# Patient Record
Sex: Female | Born: 1950
Health system: Southern US, Community
[De-identification: ages and names within clinical notes are randomized; demographics above are authoritative.]

## PROBLEM LIST (undated history)

## (undated) DIAGNOSIS — I10 Essential (primary) hypertension: Secondary | ICD-10-CM

## (undated) DIAGNOSIS — G4730 Sleep apnea, unspecified: Secondary | ICD-10-CM

## (undated) DIAGNOSIS — C4490 Unspecified malignant neoplasm of skin, unspecified: Secondary | ICD-10-CM

## (undated) DIAGNOSIS — I4891 Unspecified atrial fibrillation: Secondary | ICD-10-CM

## (undated) DIAGNOSIS — E785 Hyperlipidemia, unspecified: Secondary | ICD-10-CM

## (undated) DIAGNOSIS — S62109A Fracture of unspecified carpal bone, unspecified wrist, initial encounter for closed fracture: Secondary | ICD-10-CM

## (undated) DIAGNOSIS — H269 Unspecified cataract: Secondary | ICD-10-CM

## (undated) DIAGNOSIS — N891 Moderate vaginal dysplasia: Secondary | ICD-10-CM

## (undated) DIAGNOSIS — K5790 Diverticulosis of intestine, part unspecified, without perforation or abscess without bleeding: Secondary | ICD-10-CM

## (undated) DIAGNOSIS — K635 Polyp of colon: Secondary | ICD-10-CM

## (undated) DIAGNOSIS — Z972 Presence of dental prosthetic device (complete) (partial): Secondary | ICD-10-CM

## (undated) DIAGNOSIS — F32A Depression, unspecified: Secondary | ICD-10-CM

## (undated) DIAGNOSIS — G43909 Migraine, unspecified, not intractable, without status migrainosus: Secondary | ICD-10-CM

## (undated) DIAGNOSIS — Z973 Presence of spectacles and contact lenses: Secondary | ICD-10-CM

## (undated) DIAGNOSIS — F329 Major depressive disorder, single episode, unspecified: Secondary | ICD-10-CM

## (undated) DIAGNOSIS — F419 Anxiety disorder, unspecified: Secondary | ICD-10-CM

## (undated) HISTORY — PX: TUBAL LIGATION: SHX77

## (undated) HISTORY — DX: Unspecified atrial fibrillation: I48.91

## (undated) HISTORY — DX: Polyp of colon: K63.5

## (undated) HISTORY — DX: Sleep apnea, unspecified: G47.30

## (undated) HISTORY — PX: POLYPECTOMY: SHX149

## (undated) HISTORY — PX: SKIN CANCER EXCISION: SHX779

## (undated) HISTORY — DX: Essential (primary) hypertension: I10

## (undated) HISTORY — DX: Anxiety disorder, unspecified: F41.9

## (undated) HISTORY — DX: Major depressive disorder, single episode, unspecified: F32.9

## (undated) HISTORY — DX: Diverticulosis of intestine, part unspecified, without perforation or abscess without bleeding: K57.90

## (undated) HISTORY — DX: Unspecified malignant neoplasm of skin, unspecified: C44.90

## (undated) HISTORY — DX: Hyperlipidemia, unspecified: E78.5

## (undated) HISTORY — DX: Depression, unspecified: F32.A

## (undated) HISTORY — DX: Moderate vaginal dysplasia: N89.1

## (undated) HISTORY — DX: Fracture of unspecified carpal bone, unspecified wrist, initial encounter for closed fracture: S62.109A

## (undated) HISTORY — DX: Migraine, unspecified, not intractable, without status migrainosus: G43.909

## (undated) HISTORY — DX: Unspecified cataract: H26.9

## (undated) HISTORY — PX: COLONOSCOPY: SHX174

---

## 1998-06-16 ENCOUNTER — Ambulatory Visit: Admission: RE | Admit: 1998-06-16 | Discharge: 1998-06-16 | Payer: Self-pay | Admitting: Family Medicine

## 1998-08-12 ENCOUNTER — Ambulatory Visit: Admission: RE | Admit: 1998-08-12 | Discharge: 1998-08-12 | Payer: Self-pay | Admitting: Internal Medicine

## 1999-01-29 ENCOUNTER — Encounter: Admission: RE | Admit: 1999-01-29 | Discharge: 1999-01-29 | Payer: Self-pay | Admitting: Family Medicine

## 1999-01-29 ENCOUNTER — Encounter: Payer: Self-pay | Admitting: Family Medicine

## 1999-08-01 ENCOUNTER — Encounter: Payer: Self-pay | Admitting: Family Medicine

## 1999-08-01 ENCOUNTER — Encounter: Admission: RE | Admit: 1999-08-01 | Discharge: 1999-08-01 | Payer: Self-pay | Admitting: Family Medicine

## 2000-02-18 ENCOUNTER — Other Ambulatory Visit: Admission: RE | Admit: 2000-02-18 | Discharge: 2000-02-18 | Payer: Self-pay | Admitting: Family Medicine

## 2001-03-02 ENCOUNTER — Other Ambulatory Visit: Admission: RE | Admit: 2001-03-02 | Discharge: 2001-03-02 | Payer: Self-pay | Admitting: Family Medicine

## 2001-03-06 ENCOUNTER — Encounter: Admission: RE | Admit: 2001-03-06 | Discharge: 2001-03-06 | Payer: Self-pay | Admitting: Family Medicine

## 2001-03-06 ENCOUNTER — Encounter: Payer: Self-pay | Admitting: Family Medicine

## 2001-04-02 ENCOUNTER — Encounter: Admission: RE | Admit: 2001-04-02 | Discharge: 2001-04-02 | Payer: Self-pay | Admitting: Family Medicine

## 2001-04-02 ENCOUNTER — Encounter: Payer: Self-pay | Admitting: Family Medicine

## 2003-01-26 ENCOUNTER — Other Ambulatory Visit: Admission: RE | Admit: 2003-01-26 | Discharge: 2003-01-26 | Payer: Self-pay | Admitting: Obstetrics and Gynecology

## 2003-02-26 HISTORY — PX: DILATION AND CURETTAGE OF UTERUS: SHX78

## 2003-02-28 ENCOUNTER — Ambulatory Visit (HOSPITAL_COMMUNITY): Admission: RE | Admit: 2003-02-28 | Discharge: 2003-02-28 | Payer: Self-pay | Admitting: Obstetrics and Gynecology

## 2003-02-28 ENCOUNTER — Encounter (INDEPENDENT_AMBULATORY_CARE_PROVIDER_SITE_OTHER): Payer: Self-pay | Admitting: Specialist

## 2003-02-28 ENCOUNTER — Ambulatory Visit (HOSPITAL_BASED_OUTPATIENT_CLINIC_OR_DEPARTMENT_OTHER): Admission: RE | Admit: 2003-02-28 | Discharge: 2003-02-28 | Payer: Self-pay | Admitting: Obstetrics and Gynecology

## 2004-01-27 ENCOUNTER — Other Ambulatory Visit: Admission: RE | Admit: 2004-01-27 | Discharge: 2004-01-27 | Payer: Self-pay | Admitting: Obstetrics and Gynecology

## 2004-02-23 ENCOUNTER — Encounter: Admission: RE | Admit: 2004-02-23 | Discharge: 2004-02-23 | Payer: Self-pay | Admitting: Obstetrics and Gynecology

## 2004-04-05 ENCOUNTER — Ambulatory Visit: Payer: Self-pay

## 2004-04-06 ENCOUNTER — Ambulatory Visit: Payer: Self-pay | Admitting: Cardiology

## 2004-04-11 ENCOUNTER — Ambulatory Visit: Payer: Self-pay | Admitting: Cardiology

## 2004-04-11 ENCOUNTER — Ambulatory Visit: Payer: Self-pay | Admitting: Cardiovascular Disease

## 2004-04-18 ENCOUNTER — Ambulatory Visit: Payer: Self-pay

## 2004-04-18 ENCOUNTER — Ambulatory Visit: Payer: Self-pay | Admitting: Cardiology

## 2004-04-25 ENCOUNTER — Ambulatory Visit: Payer: Self-pay | Admitting: Cardiology

## 2004-05-09 ENCOUNTER — Ambulatory Visit: Payer: Self-pay | Admitting: *Deleted

## 2004-05-25 ENCOUNTER — Ambulatory Visit: Payer: Self-pay | Admitting: Cardiovascular Disease

## 2004-05-25 ENCOUNTER — Ambulatory Visit: Payer: Self-pay | Admitting: Cardiology

## 2004-06-05 ENCOUNTER — Ambulatory Visit: Payer: Self-pay | Admitting: Cardiology

## 2004-06-14 ENCOUNTER — Ambulatory Visit: Payer: Self-pay | Admitting: Internal Medicine

## 2004-06-28 ENCOUNTER — Ambulatory Visit: Payer: Self-pay | Admitting: Cardiology

## 2004-07-26 ENCOUNTER — Ambulatory Visit: Payer: Self-pay | Admitting: Cardiology

## 2004-07-26 ENCOUNTER — Ambulatory Visit: Payer: Self-pay | Admitting: Cardiovascular Disease

## 2004-08-02 ENCOUNTER — Ambulatory Visit: Payer: Self-pay | Admitting: Cardiology

## 2004-08-02 ENCOUNTER — Ambulatory Visit: Payer: Self-pay

## 2004-08-08 ENCOUNTER — Ambulatory Visit: Payer: Self-pay | Admitting: Cardiology

## 2004-08-09 ENCOUNTER — Ambulatory Visit: Payer: Self-pay | Admitting: Cardiovascular Disease

## 2004-08-09 ENCOUNTER — Ambulatory Visit: Payer: Self-pay | Admitting: Internal Medicine

## 2004-08-23 ENCOUNTER — Ambulatory Visit: Payer: Self-pay | Admitting: Cardiology

## 2004-09-20 ENCOUNTER — Ambulatory Visit: Payer: Self-pay | Admitting: Cardiology

## 2004-10-11 ENCOUNTER — Ambulatory Visit: Payer: Self-pay | Admitting: Cardiology

## 2004-11-09 ENCOUNTER — Ambulatory Visit: Payer: Self-pay | Admitting: Cardiovascular Disease

## 2004-11-09 ENCOUNTER — Ambulatory Visit: Payer: Self-pay | Admitting: Cardiology

## 2004-11-23 ENCOUNTER — Ambulatory Visit: Payer: Self-pay | Admitting: Cardiology

## 2004-12-10 ENCOUNTER — Ambulatory Visit: Payer: Self-pay | Admitting: Internal Medicine

## 2004-12-31 ENCOUNTER — Ambulatory Visit: Payer: Self-pay | Admitting: Internal Medicine

## 2005-01-10 ENCOUNTER — Ambulatory Visit: Payer: Self-pay | Admitting: Cardiovascular Disease

## 2005-01-28 ENCOUNTER — Ambulatory Visit: Payer: Self-pay | Admitting: Cardiology

## 2005-02-26 ENCOUNTER — Ambulatory Visit: Payer: Self-pay | Admitting: *Deleted

## 2005-02-28 ENCOUNTER — Other Ambulatory Visit: Admission: RE | Admit: 2005-02-28 | Discharge: 2005-02-28 | Payer: Self-pay | Admitting: Obstetrics and Gynecology

## 2005-03-21 ENCOUNTER — Ambulatory Visit: Payer: Self-pay | Admitting: Cardiovascular Disease

## 2005-03-21 ENCOUNTER — Ambulatory Visit: Payer: Self-pay | Admitting: Cardiology

## 2005-04-17 ENCOUNTER — Ambulatory Visit: Payer: Self-pay | Admitting: Cardiology

## 2005-05-15 ENCOUNTER — Ambulatory Visit: Payer: Self-pay | Admitting: Cardiology

## 2005-05-15 ENCOUNTER — Ambulatory Visit: Payer: Self-pay

## 2005-06-11 ENCOUNTER — Encounter: Admission: RE | Admit: 2005-06-11 | Discharge: 2005-06-11 | Payer: Self-pay | Admitting: Family Medicine

## 2005-06-12 ENCOUNTER — Ambulatory Visit: Payer: Self-pay | Admitting: *Deleted

## 2005-07-10 ENCOUNTER — Ambulatory Visit: Payer: Self-pay | Admitting: Cardiology

## 2005-08-07 ENCOUNTER — Ambulatory Visit: Payer: Self-pay | Admitting: Cardiovascular Disease

## 2005-09-04 ENCOUNTER — Ambulatory Visit: Payer: Self-pay | Admitting: Internal Medicine

## 2005-10-02 ENCOUNTER — Ambulatory Visit: Payer: Self-pay | Admitting: Cardiology

## 2005-10-30 ENCOUNTER — Ambulatory Visit: Payer: Self-pay | Admitting: *Deleted

## 2005-10-30 ENCOUNTER — Ambulatory Visit: Payer: Self-pay | Admitting: Cardiovascular Disease

## 2005-12-02 ENCOUNTER — Ambulatory Visit: Payer: Self-pay | Admitting: Cardiovascular Disease

## 2005-12-20 ENCOUNTER — Ambulatory Visit: Payer: Self-pay | Admitting: Internal Medicine

## 2006-01-10 ENCOUNTER — Ambulatory Visit: Payer: Self-pay | Admitting: Internal Medicine

## 2006-02-07 ENCOUNTER — Ambulatory Visit: Payer: Self-pay | Admitting: Cardiology

## 2006-03-07 ENCOUNTER — Ambulatory Visit: Payer: Self-pay | Admitting: Internal Medicine

## 2006-04-04 ENCOUNTER — Ambulatory Visit: Payer: Self-pay | Admitting: *Deleted

## 2006-05-02 ENCOUNTER — Ambulatory Visit: Payer: Self-pay | Admitting: Cardiology

## 2006-05-09 ENCOUNTER — Other Ambulatory Visit: Admission: RE | Admit: 2006-05-09 | Discharge: 2006-05-09 | Payer: Self-pay | Admitting: Obstetrics and Gynecology

## 2006-05-22 ENCOUNTER — Ambulatory Visit: Payer: Self-pay | Admitting: Cardiology

## 2006-06-12 ENCOUNTER — Ambulatory Visit: Payer: Self-pay | Admitting: Internal Medicine

## 2006-06-25 ENCOUNTER — Encounter: Admission: RE | Admit: 2006-06-25 | Discharge: 2006-06-25 | Payer: Self-pay | Admitting: Family Medicine

## 2006-07-10 ENCOUNTER — Ambulatory Visit: Payer: Self-pay | Admitting: Internal Medicine

## 2006-07-24 ENCOUNTER — Ambulatory Visit: Payer: Self-pay | Admitting: *Deleted

## 2006-08-21 ENCOUNTER — Ambulatory Visit: Payer: Self-pay | Admitting: Cardiology

## 2006-09-25 ENCOUNTER — Ambulatory Visit: Payer: Self-pay | Admitting: Cardiology

## 2006-09-25 ENCOUNTER — Ambulatory Visit: Payer: Self-pay | Admitting: Cardiovascular Disease

## 2006-10-23 ENCOUNTER — Ambulatory Visit: Payer: Self-pay | Admitting: Cardiology

## 2006-11-20 ENCOUNTER — Ambulatory Visit: Payer: Self-pay | Admitting: Cardiology

## 2006-12-18 ENCOUNTER — Ambulatory Visit: Payer: Self-pay | Admitting: Cardiology

## 2007-01-15 ENCOUNTER — Ambulatory Visit: Payer: Self-pay | Admitting: Cardiology

## 2007-02-02 ENCOUNTER — Ambulatory Visit: Payer: Self-pay | Admitting: Internal Medicine

## 2007-02-23 ENCOUNTER — Ambulatory Visit: Payer: Self-pay | Admitting: Cardiology

## 2007-03-23 ENCOUNTER — Ambulatory Visit: Payer: Self-pay | Admitting: Cardiology

## 2007-04-20 ENCOUNTER — Ambulatory Visit: Payer: Self-pay | Admitting: Cardiology

## 2007-05-11 ENCOUNTER — Ambulatory Visit: Payer: Self-pay | Admitting: Internal Medicine

## 2007-05-13 ENCOUNTER — Other Ambulatory Visit: Admission: RE | Admit: 2007-05-13 | Discharge: 2007-05-13 | Payer: Self-pay | Admitting: Obstetrics and Gynecology

## 2007-05-14 ENCOUNTER — Ambulatory Visit: Payer: Self-pay | Admitting: Gastroenterology

## 2007-05-20 ENCOUNTER — Ambulatory Visit: Payer: Self-pay | Admitting: Gastroenterology

## 2007-05-20 ENCOUNTER — Encounter: Payer: Self-pay | Admitting: Gastroenterology

## 2007-06-22 ENCOUNTER — Ambulatory Visit: Payer: Self-pay | Admitting: Cardiology

## 2007-06-29 ENCOUNTER — Encounter: Admission: RE | Admit: 2007-06-29 | Discharge: 2007-06-29 | Payer: Self-pay | Admitting: Obstetrics and Gynecology

## 2007-07-17 ENCOUNTER — Ambulatory Visit: Payer: Self-pay | Admitting: Internal Medicine

## 2007-08-14 ENCOUNTER — Ambulatory Visit: Payer: Self-pay | Admitting: Cardiology

## 2007-09-11 ENCOUNTER — Ambulatory Visit: Payer: Self-pay | Admitting: Cardiovascular Disease

## 2007-10-12 ENCOUNTER — Ambulatory Visit: Payer: Self-pay | Admitting: Cardiovascular Disease

## 2007-10-12 ENCOUNTER — Ambulatory Visit: Payer: Self-pay | Admitting: Cardiology

## 2007-10-26 ENCOUNTER — Ambulatory Visit: Payer: Self-pay | Admitting: Internal Medicine

## 2007-11-10 ENCOUNTER — Ambulatory Visit: Payer: Self-pay | Admitting: Cardiovascular Disease

## 2007-12-08 ENCOUNTER — Ambulatory Visit: Payer: Self-pay | Admitting: Cardiovascular Disease

## 2008-01-05 ENCOUNTER — Ambulatory Visit: Payer: Self-pay | Admitting: Cardiology

## 2008-01-11 ENCOUNTER — Emergency Department (HOSPITAL_COMMUNITY): Admission: EM | Admit: 2008-01-11 | Discharge: 2008-01-11 | Payer: Self-pay | Admitting: Emergency Medicine

## 2008-01-26 ENCOUNTER — Ambulatory Visit: Payer: Self-pay | Admitting: Cardiovascular Disease

## 2008-02-23 ENCOUNTER — Ambulatory Visit: Payer: Self-pay | Admitting: Cardiology

## 2008-03-16 ENCOUNTER — Ambulatory Visit: Payer: Self-pay | Admitting: Cardiovascular Disease

## 2008-04-05 DIAGNOSIS — F411 Generalized anxiety disorder: Secondary | ICD-10-CM | POA: Insufficient documentation

## 2008-04-05 DIAGNOSIS — I4891 Unspecified atrial fibrillation: Secondary | ICD-10-CM

## 2008-04-05 DIAGNOSIS — E782 Mixed hyperlipidemia: Secondary | ICD-10-CM

## 2008-04-05 DIAGNOSIS — I10 Essential (primary) hypertension: Secondary | ICD-10-CM | POA: Insufficient documentation

## 2008-04-06 ENCOUNTER — Encounter: Payer: Self-pay | Admitting: Cardiovascular Disease

## 2008-04-06 ENCOUNTER — Ambulatory Visit: Payer: Self-pay | Admitting: Cardiovascular Disease

## 2008-04-06 ENCOUNTER — Ambulatory Visit: Payer: Self-pay | Admitting: Cardiology

## 2008-04-06 LAB — CONVERTED CEMR LAB
ALT: 17 units/L (ref 0–35)
AST: 20 units/L (ref 0–37)
Albumin: 4.2 g/dL (ref 3.5–5.2)
Alkaline Phosphatase: 52 units/L (ref 39–117)
Total Bilirubin: 0.8 mg/dL (ref 0.3–1.2)
Triglycerides: 151 mg/dL — ABNORMAL HIGH (ref 0–149)

## 2008-05-03 ENCOUNTER — Telehealth: Payer: Self-pay | Admitting: Gastroenterology

## 2008-05-04 ENCOUNTER — Ambulatory Visit: Payer: Self-pay | Admitting: Cardiology

## 2008-05-16 ENCOUNTER — Other Ambulatory Visit: Admission: RE | Admit: 2008-05-16 | Discharge: 2008-05-16 | Payer: Self-pay | Admitting: Obstetrics & Gynecology

## 2008-05-30 ENCOUNTER — Ambulatory Visit: Payer: Self-pay | Admitting: Gastroenterology

## 2008-06-01 ENCOUNTER — Ambulatory Visit: Payer: Self-pay | Admitting: Internal Medicine

## 2008-06-13 ENCOUNTER — Encounter: Payer: Self-pay | Admitting: Cardiovascular Disease

## 2008-06-15 ENCOUNTER — Ambulatory Visit: Payer: Self-pay | Admitting: Gastroenterology

## 2008-06-15 ENCOUNTER — Encounter: Payer: Self-pay | Admitting: Gastroenterology

## 2008-06-19 ENCOUNTER — Encounter: Payer: Self-pay | Admitting: Gastroenterology

## 2008-06-22 ENCOUNTER — Ambulatory Visit: Payer: Self-pay | Admitting: Internal Medicine

## 2008-06-29 ENCOUNTER — Ambulatory Visit: Payer: Self-pay | Admitting: Cardiovascular Disease

## 2008-07-27 ENCOUNTER — Ambulatory Visit: Payer: Self-pay | Admitting: Cardiology

## 2008-07-27 ENCOUNTER — Encounter: Payer: Self-pay | Admitting: *Deleted

## 2008-08-17 ENCOUNTER — Ambulatory Visit: Payer: Self-pay | Admitting: Cardiology

## 2008-08-31 ENCOUNTER — Encounter: Payer: Self-pay | Admitting: *Deleted

## 2008-09-07 ENCOUNTER — Ambulatory Visit: Payer: Self-pay | Admitting: Cardiology

## 2008-09-28 ENCOUNTER — Ambulatory Visit: Payer: Self-pay | Admitting: Cardiology

## 2008-10-12 DIAGNOSIS — Z85828 Personal history of other malignant neoplasm of skin: Secondary | ICD-10-CM

## 2008-10-12 DIAGNOSIS — G4730 Sleep apnea, unspecified: Secondary | ICD-10-CM | POA: Insufficient documentation

## 2008-10-12 DIAGNOSIS — Z8679 Personal history of other diseases of the circulatory system: Secondary | ICD-10-CM | POA: Insufficient documentation

## 2008-10-18 ENCOUNTER — Ambulatory Visit: Payer: Self-pay | Admitting: Cardiovascular Disease

## 2008-10-18 LAB — CONVERTED CEMR LAB: POC INR: 2.3

## 2008-11-15 ENCOUNTER — Ambulatory Visit: Payer: Self-pay | Admitting: Internal Medicine

## 2008-11-15 LAB — CONVERTED CEMR LAB: POC INR: 3

## 2008-12-13 ENCOUNTER — Ambulatory Visit: Payer: Self-pay | Admitting: Internal Medicine

## 2009-01-10 ENCOUNTER — Ambulatory Visit: Payer: Self-pay | Admitting: Cardiology

## 2009-01-10 LAB — CONVERTED CEMR LAB: POC INR: 2.8

## 2009-02-07 ENCOUNTER — Ambulatory Visit: Payer: Self-pay | Admitting: Cardiovascular Disease

## 2009-02-28 ENCOUNTER — Ambulatory Visit: Payer: Self-pay | Admitting: Cardiovascular Disease

## 2009-02-28 ENCOUNTER — Encounter (INDEPENDENT_AMBULATORY_CARE_PROVIDER_SITE_OTHER): Payer: Self-pay | Admitting: Cardiology

## 2009-02-28 LAB — CONVERTED CEMR LAB: POC INR: 2

## 2009-04-05 ENCOUNTER — Ambulatory Visit: Payer: Self-pay | Admitting: Cardiovascular Disease

## 2009-05-03 ENCOUNTER — Ambulatory Visit: Payer: Self-pay | Admitting: Cardiovascular Disease

## 2009-05-31 ENCOUNTER — Ambulatory Visit: Payer: Self-pay | Admitting: Cardiology

## 2009-06-28 ENCOUNTER — Ambulatory Visit: Payer: Self-pay | Admitting: Cardiovascular Disease

## 2009-06-28 LAB — CONVERTED CEMR LAB: POC INR: 1.8

## 2009-07-26 ENCOUNTER — Ambulatory Visit: Payer: Self-pay | Admitting: Cardiology

## 2009-07-26 LAB — CONVERTED CEMR LAB: POC INR: 4

## 2009-08-04 ENCOUNTER — Ambulatory Visit: Payer: Self-pay | Admitting: Cardiology

## 2009-08-04 LAB — CONVERTED CEMR LAB: POC INR: 2.9

## 2009-08-31 ENCOUNTER — Ambulatory Visit: Payer: Self-pay | Admitting: Internal Medicine

## 2009-08-31 LAB — CONVERTED CEMR LAB: POC INR: 3.2

## 2009-09-28 ENCOUNTER — Ambulatory Visit: Payer: Self-pay | Admitting: Internal Medicine

## 2009-10-19 ENCOUNTER — Ambulatory Visit: Payer: Self-pay | Admitting: Internal Medicine

## 2009-10-19 LAB — CONVERTED CEMR LAB: POC INR: 2.5

## 2009-11-16 ENCOUNTER — Ambulatory Visit: Payer: Self-pay | Admitting: Internal Medicine

## 2009-12-14 ENCOUNTER — Ambulatory Visit: Payer: Self-pay | Admitting: Cardiovascular Disease

## 2009-12-14 LAB — CONVERTED CEMR LAB: POC INR: 3.6

## 2009-12-28 ENCOUNTER — Ambulatory Visit: Payer: Self-pay | Admitting: Cardiology

## 2010-01-25 ENCOUNTER — Ambulatory Visit: Payer: Self-pay | Admitting: Cardiology

## 2010-01-25 LAB — CONVERTED CEMR LAB: POC INR: 2.4

## 2010-02-22 ENCOUNTER — Ambulatory Visit: Payer: Self-pay | Admitting: Cardiovascular Disease

## 2010-03-22 ENCOUNTER — Ambulatory Visit: Admission: RE | Admit: 2010-03-22 | Discharge: 2010-03-22 | Payer: Self-pay | Source: Home / Self Care

## 2010-03-22 LAB — CONVERTED CEMR LAB: POC INR: 2.6

## 2010-03-27 NOTE — Medication Information (Signed)
Summary: rov/jm  Anticoagulant Therapy  Managed by: Shelby Dubin, PharmD, BCPS, CPP Referring MD: Charlton Haws MD Supervising MD: Eden Emms MD, Theron Arista Indication 1: Atrial Fibrillation (ICD-427.31) Lab Used: [facility] Site: [facility] INR POC 2.0 INR RANGE 2 - 3  Dietary changes: no    Health status changes: no    Bleeding/hemorrhagic complications: no    Recent/future hospitalizations: no    Any changes in medication regimen? no    Recent/future dental: no  Any missed doses?: no       Is patient compliant with meds? yes       Current Medications (verified): 1)  Zoloft 50 Mg Tabs (Sertraline Hcl) .Marland Kitchen.. 1 Tab Daily 2)  Accupril 20 Mg Tabs (Quinapril Hcl) .Marland Kitchen.. 1 Tab Daily 3)  Verapamil Hcl Cr 240 Mg Xr24h-Cap (Verapamil Hcl) .Marland Kitchen.. 1 Tab Daily 4)  Warfarin Sodium 5 Mg Tabs (Warfarin Sodium) .... Use As Directed By Anticoagulation Clinic 5)  Vitamin D (Ergocalciferol) 50000 Unit Caps (Ergocalciferol) .Marland Kitchen.. 1 Cap Every Other Week. 6)  Lipitor 40 Mg Tabs (Atorvastatin Calcium) .... Take 1 Tablet By Mouth At Bedtime  Allergies (verified): 1)  ! Sulfa  Anticoagulation Management History:      The patient is taking warfarin and comes in today for a routine follow up visit.  Negative risk factors for bleeding include an age less than 46 years old.  The bleeding index is 'low risk'.  Positive CHADS2 values include History of HTN.  Negative CHADS2 values include Age > 12 years old.  The start date was 04/03/2004.  Anticoagulation responsible provider: Eden Emms MD, Theron Arista.  INR POC: 2.0.  Cuvette Lot#: [id number].  Exp: 03/2010.    Anticoagulation Management Assessment/Plan:      The patient's current anticoagulation dose is Warfarin sodium 5 mg tabs: Use as directed by Anticoagulation Clinic.  The target INR is 2 - 3.  The next INR is due 03/28/2009.  Anticoagulation instructions were given to patient.  Results were reviewed/authorized by Shelby Dubin, PharmD, BCPS, CPP.  She was notified by  Shelby Dubin PharmD, BCPS, CPP.         Prior Anticoagulation Instructions: INR 3.5  DO NOT TAKE COUMADIN TODAY.  CONTINUE TO TAKE 1.5 TABS ON MONDAY, WEDNESDAY, FRIDAY AND 1 TAB EVERY OTHER DAY.  RECHECK IN 3 WEEKS.  Current Anticoagulation Instructions: INR 2.0 Take 1.5 tabs today, then continue 1 tab daily except 1.5 tabs each Monday, Wednesday, and Friday.    Recheck in 4 weeks.

## 2010-03-27 NOTE — Medication Information (Signed)
Summary: rov/tm  Anticoagulant Therapy  Managed by: Elaina Pattee, PharmD Referring MD: Charlton Haws MD Supervising MD: Gala Romney MD, Reuel Boom Indication 1: Atrial Fibrillation (ICD-427.31) Lab Used: [facility] Site: [facility] INR POC 2.7 INR RANGE 2 - 3  Dietary changes: no    Health status changes: no    Bleeding/hemorrhagic complications: no    Recent/future hospitalizations: no    Any changes in medication regimen? no    Recent/future dental: no  Any missed doses?: no       Is patient compliant with meds? yes      Comments: Pt considering Pradaxa.  Allergies: 1)  ! Sulfa  Anticoagulation Management History:      The patient is taking warfarin and comes in today for a routine follow up visit.  Negative risk factors for bleeding include an age less than 56 years old.  The bleeding index is 'low risk'.  Positive CHADS2 values include History of HTN.  Negative CHADS2 values include Age > 95 years old.  The start date was 04/03/2004.  Anticoagulation responsible provider: Luiscarlos Kaczmarczyk MD, Reuel Boom.  INR POC: 2.7.  Cuvette Lot#: [id number].  Exp: 06/2010.    Anticoagulation Management Assessment/Plan:      The patient's current anticoagulation dose is Warfarin sodium 5 mg tabs: Use as directed by Anticoagulation Clinic.  The target INR is 2 - 3.  The next INR is due 06/28/2009.  Anticoagulation instructions were given to patient.  Results were reviewed/authorized by Elaina Pattee, PharmD.  She was notified by Elaina Pattee, PharmD.         Prior Anticoagulation Instructions: INR 3.1 Today only take 2.5mg s then resume 5mg s everday except 7.5mg s on Mondays, Wednesdays and Fridays. Recheck in 4 weeks.   Current Anticoagulation Instructions: INR 2.7. Take 5 mg daily except 7.5 mg on Mon, Wed, and Fri.

## 2010-03-27 NOTE — Letter (Signed)
Summary: Handout Printed  Printed Handout:  - Coumadin Instructions-w/out Meds 

## 2010-03-27 NOTE — Medication Information (Signed)
Summary: rov/eac  Anticoagulant Therapy  Managed by: Cloyde Reams, RN, BSN Referring MD: Tito Dine MD: Patty Sermons Indication 1: Atrial Fibrillation (ICD-427.31) Lab Used: [facility] Site: [facility] INR POC 2.7 INR RANGE 2 - 3  Dietary changes: no    Health status changes: no    Bleeding/hemorrhagic complications: no    Recent/future hospitalizations: no    Any changes in medication regimen? no    Recent/future dental: no  Any missed doses?: no       Is patient compliant with meds? yes       Allergies: 1)  ! Sulfa  Anticoagulation Management History:      The patient is taking warfarin and comes in today for a routine follow up visit.  Negative risk factors for bleeding include an age less than 70 years old.  The bleeding index is 'low risk'.  Positive CHADS2 values include History of HTN.  Negative CHADS2 values include Age > 92 years old.  The start date was 04/03/2004.  Anticoagulation responsible provider: Corra Kaine.  INR POC: 2.7.  Cuvette Lot#: [id number].  Exp: 01/2011.    Anticoagulation Management Assessment/Plan:      The patient's current anticoagulation dose is Warfarin sodium 5 mg tabs: Use as directed by Anticoagulation Clinic.  The target INR is 2 - 3.  The next INR is due 01/25/2010.  Anticoagulation instructions were given to patient.  Results were reviewed/authorized by Cloyde Reams, RN, BSN.  She was notified by Cloyde Reams RN.         Prior Anticoagulation Instructions: INR 3.6  Do NOT take coumadin today.  Then start NEW dosing schedule of 1.5 tablets on Monday only and 1 tablet all other days.  Return to clinic in 2 weeks.   Current Anticoagulation Instructions: INR 2.7  Continue on same dosage 1 tablet daily except 1.5 tablets on Mondays.  Recheck in 4 weeks.

## 2010-03-27 NOTE — Medication Information (Signed)
Summary: rov/ewj  Anticoagulant Therapy  Managed by: Weston Brass, PharmD Referring MD: Tito Dine MD: Tenny Craw MD, Gunnar Fusi Indication 1: Atrial Fibrillation (ICD-427.31) Lab Used: [facility] Site: [facility] INR POC 3.1 INR RANGE 2 - 3  Dietary changes: no    Health status changes: no    Bleeding/hemorrhagic complications: no    Recent/future hospitalizations: no    Any changes in medication regimen? no    Recent/future dental: no  Any missed doses?: no       Is patient compliant with meds? yes       Allergies: 1)  ! Sulfa  Anticoagulation Management History:      The patient is taking warfarin and comes in today for a routine follow up visit.  Negative risk factors for bleeding include an age less than 73 years old.  The bleeding index is 'low risk'.  Positive CHADS2 values include History of HTN.  Negative CHADS2 values include Age > 42 years old.  The start date was 04/03/2004.  Anticoagulation responsible provider: Tenny Craw MD, Gunnar Fusi.  INR POC: 3.1.  Cuvette Lot#: [id number].  Exp: 11/2010.    Anticoagulation Management Assessment/Plan:      The patient's current anticoagulation dose is Warfarin sodium 5 mg tabs: Use as directed by Anticoagulation Clinic.  The target INR is 2 - 3.  The next INR is due 10/19/2009.  Anticoagulation instructions were given to patient.  Results were reviewed/authorized by Weston Brass, PharmD.  She was notified by Gweneth Fritter, PharmD Candidate.         Prior Anticoagulation Instructions: INR 3.2  Take 1/2 tablet today, then start taking 1 tablet daily except 1/2 tablets on Mondays and Fridays.  Recheck in 3-4 weeks.    Current Anticoagulation Instructions: INR- 3.1   Continue taking 1 tablet (5mg ) daily except take 1.5 (7.5mg ) Mon and Fri.

## 2010-03-27 NOTE — Medication Information (Signed)
Summary: rov/sp  Anticoagulant Therapy  Managed by: Elaina Pattee, PharmD Referring MD: Charlton Haws MD Supervising MD: Myrtis Ser MD, Tinnie Gens Indication 1: Atrial Fibrillation (ICD-427.31) Lab Used: [facility] Site: [facility] INR POC 4.0 INR RANGE 2 - 3  Dietary changes: no    Health status changes: no    Bleeding/hemorrhagic complications: no    Recent/future hospitalizations: no    Any changes in medication regimen? no    Recent/future dental: no  Any missed doses?: no       Is patient compliant with meds? yes      Comments: Reviewed possible reasons for increased INRs.  Patient reports no changes except maybe less greens.  Current Medications (verified): 1)  Zoloft 50 Mg Tabs (Sertraline Hcl) .Marland Kitchen.. 1 Tab Daily 2)  Accupril 20 Mg Tabs (Quinapril Hcl) .Marland Kitchen.. 1 Tab Daily 3)  Verapamil Hcl Cr 240 Mg Xr24h-Cap (Verapamil Hcl) .Marland Kitchen.. 1 Tab Daily 4)  Warfarin Sodium 5 Mg Tabs (Warfarin Sodium) .... Use As Directed By Anticoagulation Clinic 5)  Lipitor 40 Mg Tabs (Atorvastatin Calcium) .... Take 1 Tablet By Mouth At Bedtime  Allergies (verified): 1)  ! Sulfa  Anticoagulation Management History:      The patient is taking warfarin and comes in today for a routine follow up visit.  Negative risk factors for bleeding include an age less than 49 years old.  The bleeding index is 'low risk'.  Positive CHADS2 values include History of HTN.  Negative CHADS2 values include Age > 84 years old.  The start date was 04/03/2004.  Anticoagulation responsible provider: Myrtis Ser MD, Tinnie Gens.  INR POC: 4.0.  Cuvette Lot#: [id number].  Exp: 09/2010.    Anticoagulation Management Assessment/Plan:      The patient's current anticoagulation dose is Warfarin sodium 5 mg tabs: Use as directed by Anticoagulation Clinic.  The target INR is 2 - 3.  The next INR is due 08/04/2009.  Anticoagulation instructions were given to patient.  Results were reviewed/authorized by Elaina Pattee, PharmD.  She was notified by  Elaina Pattee, PharmD.         Prior Anticoagulation Instructions: INR 1.8  Take 2 tablets today then continue same dose of 1 tablet every day except 1 1/2 tablets on Monday, Wendesday and Friday   Current Anticoagulation Instructions: INR 4.0. Hold Coumadin today, then take 0.5 tablet on Thursday, then take 1 tablet daily except 1.5 tablets on Mon, Wed, Fri.

## 2010-03-27 NOTE — Medication Information (Signed)
Summary: ROV/TM  Anticoagulant Therapy  Managed by: Weston Brass, PharmD Referring MD: Tito Dine MD: Gala Romney MD, Reuel Boom Indication 1: Atrial Fibrillation (ICD-427.31) Lab Used: [facility] Site: [facility] INR POC 3.2 INR RANGE 2 - 3  Dietary changes: yes       Details: less greens  Health status changes: no    Bleeding/hemorrhagic complications: no    Recent/future hospitalizations: no    Any changes in medication regimen? no    Recent/future dental: no  Any missed doses?: no       Is patient compliant with meds? yes       Allergies: 1)  ! Sulfa  Anticoagulation Management History:      The patient is taking warfarin and comes in today for a routine follow up visit.  Negative risk factors for bleeding include an age less than 76 years old.  The bleeding index is 'low risk'.  Positive CHADS2 values include History of HTN.  Negative CHADS2 values include Age > 29 years old.  The start date was 04/03/2004.  Anticoagulation responsible provider: Bensimhon MD, Reuel Boom.  INR POC: 3.2.  Cuvette Lot#: [id number].  Exp: 12/2010.    Anticoagulation Management Assessment/Plan:      The patient's current anticoagulation dose is Warfarin sodium 5 mg tabs: Use as directed by Anticoagulation Clinic.  The target INR is 2 - 3.  The next INR is due 12/14/2009.  Anticoagulation instructions were given to patient.  Results were reviewed/authorized by Weston Brass, PharmD.  She was notified by Harrel Carina, PharmD candidate.         Prior Anticoagulation Instructions: INR 2.5 Continue 5mg s everyday except 7.5mg s on Mondays and Fridays. Recheck in 4 weeks.    Current Anticoagulation Instructions: INR 3.2  Take 1/2 tablet today. Then resume regular schedule of 1 tablet everyday except 1 1/2 tablets on Mondays and Fridays. Re-check INR in 4 weeks.

## 2010-03-27 NOTE — Medication Information (Signed)
Summary: rov/jk  Anticoagulant Therapy  Managed by: Bethena Midget, RN, BSN Referring MD: Tito Dine MD: Graciela Husbands MD, Viviann Spare Indication 1: Atrial Fibrillation (ICD-427.31) Lab Used: [facility] Site: [facility] INR POC 2.5 INR RANGE 2 - 3  Dietary changes: no    Health status changes: no    Bleeding/hemorrhagic complications: no    Recent/future hospitalizations: no    Any changes in medication regimen? no    Recent/future dental: no  Any missed doses?: no       Is patient compliant with meds? yes       Allergies: 1)  ! Sulfa  Anticoagulation Management History:      The patient is taking warfarin and comes in today for a routine follow up visit.  Negative risk factors for bleeding include an age less than 75 years old.  The bleeding index is 'low risk'.  Positive CHADS2 values include History of HTN.  Negative CHADS2 values include Age > 45 years old.  The start date was 04/03/2004.  Anticoagulation responsible Nicholai Willette: Graciela Husbands MD, Viviann Spare.  INR POC: 2.5.  Cuvette Lot#: [id number].  Exp: 11/2010.    Anticoagulation Management Assessment/Plan:      The patient's current anticoagulation dose is Warfarin sodium 5 mg tabs: Use as directed by Anticoagulation Clinic.  The target INR is 2 - 3.  The next INR is due 11/16/2009.  Anticoagulation instructions were given to patient.  Results were reviewed/authorized by Bethena Midget, RN, BSN.  She was notified by Bethena Midget, RN, BSN.         Prior Anticoagulation Instructions: INR- 3.1   Continue taking 1 tablet (5mg ) daily except take 1.5 (7.5mg ) Mon and Fri.    Current Anticoagulation Instructions: INR 2.5 Continue 5mg s everyday except 7.5mg s on Mondays and Fridays. Recheck in 4 weeks.

## 2010-03-27 NOTE — Assessment & Plan Note (Signed)
Summary: f 6 mon/coumadin at 8:15   CC:  check up.  History of Present Illness: Linda Ruiz is seen today for F/U of her chronic afib, hypertension.  She is asymptomatic with no palpitations, PNC, orthopnea, diaphoresis or syncope.  She continues to work in [employer]	[place].  Her coumadin has been therapeutic with no bleeding diathesis.  She does not have enough riks factors to qualify for the Engage trial.  I discussed this with Paula Compton in our research department.  She does not like being on coumadin but understands that it prevents strokes Now that Pradaxa is aproved we will give her forms to ask the insurance company what the copay would be to start 150mg  two times a day.  She is excited about the possibility of not having to have INR's checked.  I did tell her thath there is no way to reverse Pradaxa like giving FFP to coumadin patients and that it was two times a day but overall I think it would be a good switch for her.  She is now seeing Dr Jacky Kindle for her primary care needs  Current Problems (verified): 1)  Atrial Fibrillation  (ICD-427.31) 2)  Coumadin Therapy  (ICD-V58.61) 3)  Essential Hypertension, Benign  (ICD-401.1) 4)  Mixed Hyperlipidemia  (ICD-272.2) 5)  Anxiety State, Unspecified  (ICD-300.00) 6)  Cerebrovascular Accident, Hx of  (ICD-V12.50) 7)  Skin Cancer, Hx of  (ICD-V10.83) 8)  Sleep Apnea  (ICD-780.57)  Current Medications (verified): 1)  Zoloft 50 Mg Tabs (Sertraline Hcl) .Marland Kitchen.. 1 Tab Daily 2)  Accupril 20 Mg Tabs (Quinapril Hcl) .Marland Kitchen.. 1 Tab Daily 3)  Verapamil Hcl Cr 240 Mg Xr24h-Cap (Verapamil Hcl) .Marland Kitchen.. 1 Tab Daily 4)  Warfarin Sodium 5 Mg Tabs (Warfarin Sodium) .... Use As Directed By Anticoagulation Clinic 5)  Vitamin D (Ergocalciferol) 50000 Unit Caps (Ergocalciferol) .Marland Kitchen.. 1 Cap Every Other Week. 6)  Lipitor 40 Mg Tabs (Atorvastatin Calcium) .... Take 1 Tablet By Mouth At Bedtime  Allergies (verified): 1)  ! Sulfa  Past History:  Past Medical History: Last  updated: 2008/11/09 Current Problems:  ATRIAL FIBRILLATION (ICD-427.31) COUMADIN THERAPY (ICD-V58.61) ESSENTIAL HYPERTENSION, BENIGN (ICD-401.1) MIXED HYPERLIPIDEMIA (ICD-272.2) ANXIETY STATE, UNSPECIFIED (ICD-300.00) CEREBROVASCULAR ACCIDENT, HX OF (ICD-V12.50) SKIN CANCER, HX OF (ICD-V10.83) SLEEP APNEA (ICD-780.57)  Past Surgical History: Last updated: 11-09-2008 2005 D&C Endometrial Polyp removal c-section tubal ligation  Family History: Last updated: Nov 09, 2008 Mother: deceased age 61/cancer Father: deceased age 61/ cancer  Social History: Last updated: 11-09-08 Married 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   Vital Signs:  Patient profile:   59 year old female Height:      62 inches Weight:      196 pounds BMI:     35.98 Pulse rate:   73 / minute Resp:     12 per minute BP sitting:   142 / 89  (left arm)  Vitals Entered By: Kem Parkinson (May 03, 2009 3:46 PM)  Physical Exam  General:  Affect appropriate Healthy:  appears stated age HEENT: normal Neck supple with no adenopathy JVP normal no bruits no thyromegaly Lungs clear with no wheezing and good diaphragmatic motion Heart:  S1/S2 no murmur,rub, gallop or click PMI normal Abdomen:  benighn, BS positve, no tenderness, no AAA no bruit.  No HSM or HJR Distal pulses intact with no bruits No edema Neuro non-focal Skin warm and dry    Impression & Recommendations:  Problem # 1:  ATRIAL FIBRILLATION (ICD-427.31) Asymptomatic with good rate control Her updated medication list for this problem includes:    Warfarin Sodium 5 Mg Tabs (Warfarin sodium) ..... Use as directed by  anticoagulation clinic  Problem # 2:  COUMADIN THERAPY (ICD-V58.61) Consider changing to Pradaxa 150mg  two times a day.  Insurance to be contacted  Change would involve starting Pradaxa after holding coumadin for 3 days  Problem # 3:  ESSENTIAL HYPERTENSION, BENIGN (ICD-401.1) Well controlled Her updated medication list for this problem includes:    Accupril 20 Mg Tabs (Quinapril hcl) .Marland Kitchen... 1 tab daily    Verapamil Hcl Cr 240 Mg Xr24h-cap (Verapamil hcl) .Marland Kitchen... 1 tab daily  Problem # 4:  MIXED HYPERLIPIDEMIA (ICD-272.2) LDL less than 130 with no history of vascular disease.  She had labs last week at [facility] Her updated medication list for this problem includes:    Lipitor 40 Mg Tabs (Atorvastatin calcium) .Marland Kitchen... Take 1 tablet by mouth at bedtime  CHOL: 192 (04/06/2008)   LDL: 125 (04/06/2008)   HDL: 37.1 (04/06/2008)   TG: 151 (04/06/2008)  Patient Instructions: 1)  Your physician recommends that you schedule a follow-up appointment in: ONE YEAR

## 2010-03-27 NOTE — Medication Information (Signed)
Summary: rov/cb  Anticoagulant Therapy  Managed by: Elaina Pattee, PharmD Referring MD: Charlton Haws MD Supervising MD: Myrtis Ser MD, Tinnie Gens Indication 1: Atrial Fibrillation (ICD-427.31) Lab Used: [facility] Site: [facility] INR POC 2.9 INR RANGE 2 - 3  Dietary changes: yes       Details: Has started eating more greens.  Health status changes: no    Bleeding/hemorrhagic complications: no    Recent/future hospitalizations: no    Any changes in medication regimen? no    Recent/future dental: no  Any missed doses?: no       Is patient compliant with meds? yes       Allergies: 1)  ! Sulfa  Anticoagulation Management History:      The patient is taking warfarin and comes in today for a routine follow up visit.  Negative risk factors for bleeding include an age less than 55 years old.  The bleeding index is 'low risk'.  Positive CHADS2 values include History of HTN.  Negative CHADS2 values include Age > 51 years old.  The start date was 04/03/2004.  Anticoagulation responsible provider: Myrtis Ser MD, Tinnie Gens.  INR POC: 2.9.  Cuvette Lot#: [id number].  Exp: 09/2010.    Anticoagulation Management Assessment/Plan:      The patient's current anticoagulation dose is Warfarin sodium 5 mg tabs: Use as directed by Anticoagulation Clinic.  The target INR is 2 - 3.  The next INR is due 08/31/2009.  Anticoagulation instructions were given to patient.  Results were reviewed/authorized by Elaina Pattee, PharmD.  She was notified by Elaina Pattee, PharmD.         Prior Anticoagulation Instructions: INR 4.0. Hold Coumadin today, then take 0.5 tablet on Thursday, then take 1 tablet daily except 1.5 tablets on Mon, Wed, Fri.  Current Anticoagulation Instructions: INR 2.9. Take 1 tablet daily except 1.5 tablets Mon, Wed, Fri.  Recheck in 4 weeks.

## 2010-03-27 NOTE — Medication Information (Signed)
Summary: rovm/appt with Jaleesa Cervi at 8:30  Anticoagulant Therapy  Managed by: Cloyde Reams, RN, BSN Referring MD: Charlton Haws MD Supervising MD: Eden Emms MD, Theron Arista Indication 1: Atrial Fibrillation (ICD-427.31) Lab Used: [facility] Site: [facility] INR POC 2.5 INR RANGE 2 - 3  Dietary changes: no    Health status changes: no    Bleeding/hemorrhagic complications: no    Recent/future hospitalizations: no    Any changes in medication regimen? no    Recent/future dental: no  Any missed doses?: no       Is patient compliant with meds? yes       Allergies (verified): 1)  ! Sulfa  Anticoagulation Management History:      The patient is taking warfarin and comes in today for a routine follow up visit.  Negative risk factors for bleeding include an age less than 55 years old.  The bleeding index is 'low risk'.  Positive CHADS2 values include History of HTN.  Negative CHADS2 values include Age > 66 years old.  The start date was 04/03/2004.  Anticoagulation responsible provider: Eden Emms MD, Theron Arista.  INR POC: 2.5.  Cuvette Lot#: [id number].  Exp: 05/2010.    Anticoagulation Management Assessment/Plan:      The patient's current anticoagulation dose is Warfarin sodium 5 mg tabs: Use as directed by Anticoagulation Clinic.  The target INR is 2 - 3.  The next INR is due 05/03/2009.  Anticoagulation instructions were given to patient.  Results were reviewed/authorized by Cloyde Reams, RN, BSN.  She was notified by Cloyde Reams RN.         Prior Anticoagulation Instructions: INR 2.0 Take 1.5 tabs today, then continue 1 tab daily except 1.5 tabs each Monday, Wednesday, and Friday.    Recheck in 4 weeks.  Current Anticoagulation Instructions: INR 2.5  Continue on same dosage 1 tablet daily except 1.5 tablets on Mondays, Wednesdays, and Fridays.  Recheck in 4 weeks.

## 2010-03-27 NOTE — Medication Information (Signed)
Summary: rov/jaj  Anticoagulant Therapy  Managed by: Eda Keys, PharmD Referring MD: Tito Dine MD: Eden Emms MD, Theron Arista Indication 1: Atrial Fibrillation (ICD-427.31) Lab Used: [facility] Site: [facility] INR POC 3.6 INR RANGE 2 - 3  Dietary changes: yes       Details: Pt has started drinking "Naked" brand juices, about every other day  Health status changes: no    Bleeding/hemorrhagic complications: no    Recent/future hospitalizations: no    Any changes in medication regimen? no    Recent/future dental: no  Any missed doses?: no       Is patient compliant with meds? yes       Allergies: 1)  ! Sulfa  Anticoagulation Management History:      The patient is taking warfarin and comes in today for a routine follow up visit.  Negative risk factors for bleeding include an age less than 58 years old.  The bleeding index is 'low risk'.  Positive CHADS2 values include History of HTN.  Negative CHADS2 values include Age > 28 years old.  The start date was 04/03/2004.  Anticoagulation responsible provider: Eden Emms MD, Theron Arista.  INR POC: 3.6.  Cuvette Lot#: [id number].  Exp: 01/2011.    Anticoagulation Management Assessment/Plan:      The patient's current anticoagulation dose is Warfarin sodium 5 mg tabs: Use as directed by Anticoagulation Clinic.  The target INR is 2 - 3.  The next INR is due 12/28/2009.  Anticoagulation instructions were given to patient.  Results were reviewed/authorized by Eda Keys, PharmD.  She was notified by Eda Keys.         Prior Anticoagulation Instructions: INR 3.2  Take 1/2 tablet today. Then resume regular schedule of 1 tablet everyday except 1 1/2 tablets on Mondays and Fridays. Re-check INR in 4 weeks.   Current Anticoagulation Instructions: INR 3.6  Do NOT take coumadin today.  Then start NEW dosing schedule of 1.5 tablets on Monday only and 1 tablet all other days.  Return to clinic in 2 weeks.

## 2010-03-27 NOTE — Medication Information (Signed)
Summary: rov/ewj  Anticoagulant Therapy  Managed by: Bethena Midget, RN, BSN Referring MD: Charlton Haws MD Supervising MD: Eden Emms MD, Theron Arista Indication 1: Atrial Fibrillation (ICD-427.31) Lab Used: [facility] Site: [facility] INR POC 3.1 INR RANGE 2 - 3  Dietary changes: yes       Details: Poor appetite due to flu last week.  Health status changes: yes       Details: Flu last week.   Bleeding/hemorrhagic complications: no    Recent/future hospitalizations: no    Any changes in medication regimen? yes       Details: Last week took Rx cough syrupPRN.  Recent/future dental: no  Any missed doses?: no       Is patient compliant with meds? yes      Comments: Seeing Dr. Eden Emms today.   Allergies: 1)  ! Sulfa  Anticoagulation Management History:      The patient is taking warfarin and comes in today for a routine follow up visit.  Negative risk factors for bleeding include an age less than 52 years old.  The bleeding index is 'low risk'.  Positive CHADS2 values include History of HTN.  Negative CHADS2 values include Age > 89 years old.  The start date was 04/03/2004.  Anticoagulation responsible provider: Eden Emms MD, Theron Arista.  INR POC: 3.1.  Cuvette Lot#: [id number].  Exp: 06/2010.    Anticoagulation Management Assessment/Plan:      The patient's current anticoagulation dose is Warfarin sodium 5 mg tabs: Use as directed by Anticoagulation Clinic.  The target INR is 2 - 3.  The next INR is due 05/31/2009.  Anticoagulation instructions were given to patient.  Results were reviewed/authorized by Bethena Midget, RN, BSN.  She was notified by Bethena Midget, RN, BSN.         Prior Anticoagulation Instructions: INR 2.5  Continue on same dosage 1 tablet daily except 1.5 tablets on Mondays, Wednesdays, and Fridays.  Recheck in 4 weeks.    Current Anticoagulation Instructions: INR 3.1 Today only take 2.5mg s then resume 5mg s everday except 7.5mg s on Mondays, Wednesdays and Fridays. Recheck in 4  weeks.

## 2010-03-27 NOTE — Medication Information (Signed)
Summary: rov/ewj  Anticoagulant Therapy  Managed by: Lyna Poser, PharmD Referring MD: Tito Dine MD: Jens Som MD, Arlys John Indication 1: Atrial Fibrillation (ICD-427.31) Lab Used: [facility] Site: [facility] INR POC 2.4 INR RANGE 2 - 3  Dietary changes: yes       Details: not eating as much greens  Health status changes: no    Bleeding/hemorrhagic complications: no    Recent/future hospitalizations: no    Any changes in medication regimen? no    Recent/future dental: no  Any missed doses?: no       Is patient compliant with meds? yes       Allergies: 1)  ! Sulfa  Anticoagulation Management History:      The patient is taking warfarin and comes in today for a routine follow up visit.  Negative risk factors for bleeding include an age less than 59 years old.  The bleeding index is 'low risk'.  Positive CHADS2 values include History of HTN.  Negative CHADS2 values include Age > 43 years old.  The start date was 04/03/2004.  Anticoagulation responsible provider: Jens Som MD, Arlys John.  INR POC: 2.4.  Cuvette Lot#: [id number].  Exp: 01/2011.    Anticoagulation Management Assessment/Plan:      The patient's current anticoagulation dose is Warfarin sodium 5 mg tabs: Use as directed by Anticoagulation Clinic.  The target INR is 2 - 3.  The next INR is due 02/22/2010.  Anticoagulation instructions were given to patient.  Results were reviewed/authorized by Lyna Poser, PharmD.         Prior Anticoagulation Instructions: INR 2.7  Continue on same dosage 1 tablet daily except 1.5 tablets on Mondays.  Recheck in 4 weeks.   Current Anticoagulation Instructions: INR 2.4 Continue taking 1.5 tablets on monday. And 1 tablet all other days. Recheck in 4 weeks.

## 2010-03-27 NOTE — Medication Information (Signed)
Summary: rov/cb  Anticoagulant Therapy  Managed by: Weston Brass, PharmD Referring MD: Charlton Haws MD Supervising MD: Gala Romney MD, Reuel Boom Indication 1: Atrial Fibrillation (ICD-427.31) Lab Used: [facility] Site: [facility] INR POC 1.8 INR RANGE 2 - 3  Dietary changes: no    Health status changes: no    Bleeding/hemorrhagic complications: no    Recent/future hospitalizations: no    Any changes in medication regimen? no    Recent/future dental: no  Any missed doses?: no       Is patient compliant with meds? yes       Allergies: 1)  ! Sulfa  Anticoagulation Management History:      The patient is taking warfarin and comes in today for a routine follow up visit.  Negative risk factors for bleeding include an age less than 27 years old.  The bleeding index is 'low risk'.  Positive CHADS2 values include History of HTN.  Negative CHADS2 values include Age > 59 years old.  The start date was 04/03/2004.  Anticoagulation responsible provider: Bensimhon MD, Reuel Boom.  INR POC: 1.8.  Cuvette Lot#: [id number].  Exp: 07/2010.    Anticoagulation Management Assessment/Plan:      The patient's current anticoagulation dose is Warfarin sodium 5 mg tabs: Use as directed by Anticoagulation Clinic.  The target INR is 2 - 3.  The next INR is due 07/26/2009.  Anticoagulation instructions were given to patient.  Results were reviewed/authorized by Weston Brass, PharmD.  She was notified by Weston Brass PharmD.         Prior Anticoagulation Instructions: INR 2.7. Take 5 mg daily except 7.5 mg on Mon, Wed, and Fri.  Current Anticoagulation Instructions: INR 1.8  Take 2 tablets today then continue same dose of 1 tablet every day except 1 1/2 tablets on Monday, Wendesday and Friday

## 2010-03-27 NOTE — Medication Information (Signed)
Summary: rov/cb  Anticoagulant Therapy  Managed by: Cloyde Reams, RN, BSN Referring MD: Charlton Haws MD Supervising MD: Tenny Craw MD, Gunnar Fusi Indication 1: Atrial Fibrillation (ICD-427.31) Lab Used: [facility] Site: [facility] INR POC 3.2 INR RANGE 2 - 3  Dietary changes: no    Health status changes: no    Bleeding/hemorrhagic complications: no    Recent/future hospitalizations: no    Any changes in medication regimen? no    Recent/future dental: no  Any missed doses?: no       Is patient compliant with meds? yes       Allergies: 1)  ! Sulfa  Anticoagulation Management History:      The patient is taking warfarin and comes in today for a routine follow up visit.  Negative risk factors for bleeding include an age less than 7 years old.  The bleeding index is 'low risk'.  Positive CHADS2 values include History of HTN.  Negative CHADS2 values include Age > 35 years old.  The start date was 04/03/2004.  Anticoagulation responsible provider: Tenny Craw MD, Gunnar Fusi.  INR POC: 3.2.  Cuvette Lot#: [id number].  Exp: 10/2010.    Anticoagulation Management Assessment/Plan:      The patient's current anticoagulation dose is Warfarin sodium 5 mg tabs: Use as directed by Anticoagulation Clinic.  The target INR is 2 - 3.  The next INR is due 09/28/2009.  Anticoagulation instructions were given to patient.  Results were reviewed/authorized by Cloyde Reams, RN, BSN.  She was notified by Cloyde Reams RN.         Prior Anticoagulation Instructions: INR 2.9. Take 1 tablet daily except 1.5 tablets Mon, Wed, Fri.  Recheck in 4 weeks.  Current Anticoagulation Instructions: INR 3.2  Take 1/2 tablet today, then start taking 1 tablet daily except 1/2 tablets on Mondays and Fridays.  Recheck in 3-4 weeks.

## 2010-03-29 NOTE — Medication Information (Signed)
Summary: rov/mw  Anticoagulant Therapy  Managed by: Eda Keys, PharmD Referring MD: Tito Dine MD: Eden Emms MD, Theron Arista Indication 1: Atrial Fibrillation (ICD-427.31) Lab Used: [facility] Site: [facility] INR RANGE 2 - 3  Dietary changes: no    Health status changes: no    Bleeding/hemorrhagic complications: no    Recent/future hospitalizations: no    Any changes in medication regimen? no    Recent/future dental: no  Any missed doses?: no       Is patient compliant with meds? yes       Allergies: 1)  ! Sulfa  Anticoagulation Management History:      The patient is taking warfarin and comes in today for a routine follow up visit.  Negative risk factors for bleeding include an age less than 58 years old.  The bleeding index is 'low risk'.  Positive CHADS2 values include History of HTN.  Negative CHADS2 values include Age > 32 years old.  The start date was 04/03/2004.  Anticoagulation responsible provider: Eden Emms MD, Theron Arista.  Cuvette Lot#: [id number].  Exp: 03/2011.    Anticoagulation Management Assessment/Plan:      The patient's current anticoagulation dose is Warfarin sodium 5 mg tabs: Use as directed by Anticoagulation Clinic.  The target INR is 2 - 3.  The next INR is due 03/22/2010.  Anticoagulation instructions were given to patient.  Results were reviewed/authorized by Eda Keys, PharmD.  She was notified by Eda Keys.         Prior Anticoagulation Instructions: INR 2.4 Continue taking 1.5 tablets on monday. And 1 tablet all other days. Recheck in 4 weeks.  Current Anticoagulation Instructions: INR 2.2  Continue taking 1.5 tablets on Monday and 1 tablet all other days.  Return to clinic in 4 weeks.

## 2010-03-29 NOTE — Medication Information (Signed)
Summary: rov/tp  Anticoagulant Therapy  Managed by: Geoffry Paradise, PharmD Referring MD: Tito Dine MD: Patty Sermons Indication 1: Atrial Fibrillation (ICD-427.31) Lab Used: [facility] Site: [facility] INR POC 2.6 INR RANGE 2 - 3  Dietary changes: no    Health status changes: no    Bleeding/hemorrhagic complications: no    Recent/future hospitalizations: no    Any changes in medication regimen? no    Recent/future dental: no  Any missed doses?: no       Is patient compliant with meds? yes       Allergies: 1)  ! Sulfa  Anticoagulation Management History:      The patient is taking warfarin and comes in today for a routine follow up visit.  Negative risk factors for bleeding include an age less than 67 years old.  The bleeding index is 'low risk'.  Positive CHADS2 values include History of HTN.  Negative CHADS2 values include Age > 68 years old.  The start date was 04/03/2004.  Anticoagulation responsible provider: Latamara Melder.  INR POC: 2.6.  Cuvette Lot#: [id number].  Exp: 02/2011.    Anticoagulation Management Assessment/Plan:      The patient's current anticoagulation dose is Warfarin sodium 5 mg tabs: Use as directed by Anticoagulation Clinic.  The target INR is 2 - 3.  The next INR is due 04/19/2010.  Anticoagulation instructions were given to patient.  Results were reviewed/authorized by Geoffry Paradise, PharmD.         Prior Anticoagulation Instructions: INR 2.2  Continue taking 1.5 tablets on Monday and 1 tablet all other days.  Return to clinic in 4 weeks.    Current Anticoagulation Instructions: INR: 2.6  (goal 2-3)  Your INR is at goal.  Continue coumadin 5 mg everyday except 7.5 mg on Mondays.  Return in 4 weeks for another INR check.

## 2010-04-13 ENCOUNTER — Encounter: Payer: Self-pay | Admitting: Cardiovascular Disease

## 2010-04-17 DIAGNOSIS — Z8679 Personal history of other diseases of the circulatory system: Secondary | ICD-10-CM

## 2010-04-17 DIAGNOSIS — I4891 Unspecified atrial fibrillation: Secondary | ICD-10-CM

## 2010-04-19 ENCOUNTER — Encounter (INDEPENDENT_AMBULATORY_CARE_PROVIDER_SITE_OTHER): Payer: Managed Care, Other (non HMO)

## 2010-04-19 ENCOUNTER — Encounter: Payer: Self-pay | Admitting: Cardiology

## 2010-04-19 DIAGNOSIS — I4891 Unspecified atrial fibrillation: Secondary | ICD-10-CM

## 2010-04-19 DIAGNOSIS — Z7901 Long term (current) use of anticoagulants: Secondary | ICD-10-CM

## 2010-04-24 NOTE — Medication Information (Signed)
Summary: rov/sp  Anticoagulant Therapy  Managed by: Weston Brass, PharmD Referring MD: Tito Dine MD: Myrtis Ser MD, Tinnie Gens Indication 1: Atrial Fibrillation (ICD-427.31) Lab Used: [facility] Site: [facility] INR POC 2.9 INR RANGE 2 - 3  Dietary changes: no    Health status changes: no    Bleeding/hemorrhagic complications: no    Recent/future hospitalizations: no    Any changes in medication regimen? no    Recent/future dental: no  Any missed doses?: no       Is patient compliant with meds? yes      Comments: Pt. has MD appt. in 5 weeks.  She did not want to come back in 4 for her INR at a separate appointment.  She was advised that if she was started on any new medicines or if any health changes occured, she would need to contact us to come in sooner.    Allergies: 1)  ! Sulfa  Anticoagulation Management History:      The patient is taking warfarin and comes in today for a routine follow up visit.  Negative risk factors for bleeding include an age less than 43 years old.  The bleeding index is 'low risk'.  Positive CHADS2 values include History of HTN.  Negative CHADS2 values include Age > 54 years old.  The start date was 04/03/2004.  Anticoagulation responsible provider: Myrtis Ser MD, Tinnie Gens.  INR POC: 2.9.  Cuvette Lot#: [id number].  Exp: 02/2011.    Anticoagulation Management Assessment/Plan:      The patient's current anticoagulation dose is Warfarin sodium 5 mg tabs: Use as directed by Anticoagulation Clinic.  The target INR is 2 - 3.  The next INR is due 05/25/2010.  Anticoagulation instructions were given to patient.  Results were reviewed/authorized by Weston Brass, PharmD.  She was notified by Margot Chimes PharmD Candidate.         Prior Anticoagulation Instructions: INR: 2.6  (goal 2-3)  Your INR is at goal.  Continue coumadin 5 mg everyday except 7.5 mg on Mondays.  Return in 4 weeks for another INR check.    Current Anticoagulation Instructions: INR  2.9  Continue to take 1 tablet everyday except for Mondays when you take 1 1/2 tablets.  Recheck INR in 4 weeks.

## 2010-05-08 NOTE — Letter (Signed)
Summary: [facility]   [facility]   Imported By: Kassie Mends 05/01/2010 10:45:20  _____________________________________________________________________  External Attachment:    Type:   Image     Comment:   External Document

## 2010-05-11 ENCOUNTER — Encounter: Payer: Self-pay | Admitting: Cardiovascular Disease

## 2010-05-15 ENCOUNTER — Other Ambulatory Visit: Payer: Self-pay | Admitting: Obstetrics and Gynecology

## 2010-05-15 DIAGNOSIS — Z1231 Encounter for screening mammogram for malignant neoplasm of breast: Secondary | ICD-10-CM

## 2010-05-25 ENCOUNTER — Encounter: Payer: Self-pay | Admitting: Cardiovascular Disease

## 2010-05-25 ENCOUNTER — Ambulatory Visit (INDEPENDENT_AMBULATORY_CARE_PROVIDER_SITE_OTHER): Payer: Managed Care, Other (non HMO) | Admitting: Cardiovascular Disease

## 2010-05-25 ENCOUNTER — Encounter: Payer: Self-pay | Admitting: Cardiology

## 2010-05-25 ENCOUNTER — Ambulatory Visit (INDEPENDENT_AMBULATORY_CARE_PROVIDER_SITE_OTHER): Payer: Managed Care, Other (non HMO) | Admitting: *Deleted

## 2010-05-25 VITALS — BP 122/92 | HR 99 | Resp 14 | Ht 62.0 in | Wt 203.0 lb

## 2010-05-25 DIAGNOSIS — Z8679 Personal history of other diseases of the circulatory system: Secondary | ICD-10-CM

## 2010-05-25 DIAGNOSIS — E782 Mixed hyperlipidemia: Secondary | ICD-10-CM

## 2010-05-25 DIAGNOSIS — Z7901 Long term (current) use of anticoagulants: Secondary | ICD-10-CM | POA: Insufficient documentation

## 2010-05-25 DIAGNOSIS — I10 Essential (primary) hypertension: Secondary | ICD-10-CM

## 2010-05-25 DIAGNOSIS — I4891 Unspecified atrial fibrillation: Secondary | ICD-10-CM

## 2010-05-25 MED ORDER — METOPROLOL SUCCINATE ER 50 MG PO TB24: 50.0000 mg | ORAL_TABLET | Freq: Every day | ORAL | Status: DC

## 2010-05-25 NOTE — Assessment & Plan Note (Signed)
Labs with Dr Jacky Kindle.  Target LDL under 130  Continue statin

## 2010-05-25 NOTE — Assessment & Plan Note (Signed)
Anxiety, new job,sisters illness contributing to elevation.  Toprol should help.  Low sodium diet

## 2010-05-25 NOTE — Patient Instructions (Signed)
Continue on same dosage 1 tablet daily except 1.5 tablets on Mondays.  Recheck  1 week after resumes Coumadin post surgery.

## 2010-05-25 NOTE — Progress Notes (Signed)
Linda Ruiz is seen today for F/U of her chronic afib, hypertension.  She is asymptomatic with no palpitations, PNC, orthopnea, diaphoresis or syncope.  She continues to work in [employer]	[place].  Her coumadin has been therapeutic with no bleeding diathesis.  Her sister is "terminal" and she needs foot surgery. She has some additional stress and BP and HR have been elevated.  Will clear for foot surgery and can stop coumadin for 5 days without lovenox overlap.  Start beta blocker in addition to verapamil for rate control.  Has F/U with Dr Jacky Kindle for hematuria.  Had been on cipro.  ROS: Denies fever, malais, weight loss, blurry vision, decreased visual acuity, cough, sputum, SOB, hemoptysis, pleuritic pain, palpitaitons, heartburn, abdominal pain, melena, lower extremity edema, claudication, or rash.   General: Affect appropriate Healthy:  appears stated age HEENT: normal Neck supple with no adenopathy JVP normal no bruits no thyromegaly Lungs clear with no wheezing and good diaphragmatic motion Heart:  S1/S2 no murmur,rub, gallop or click PMI normal Abdomen: benighn, BS positve, no tenderness, no AAA no bruit.  No HSM or HJR Distal pulses intact with no bruits No edema Neuro non-focal Skin warm and dry No muscular weakness   Current Outpatient Prescriptions  Medication Sig Dispense Refill  . atorvastatin (LIPITOR) 20 MG tablet Take 20 mg by mouth daily.        . quinapril (ACCUPRIL) 20 MG tablet Take 20 mg by mouth at bedtime.        . sertraline (ZOLOFT) 50 MG tablet Take 50 mg by mouth daily.        . verapamil (COVERA HS) 240 MG (CO) 24 hr tablet Take 240 mg by mouth at bedtime.        Marland Kitchen warfarin (COUMADIN) 5 MG tablet Take by mouth as directed.        Marland Kitchen DISCONTD: atorvastatin (LIPITOR) 40 MG tablet Take 40 mg by mouth daily.        Marland Kitchen DISCONTD: ciprofloxacin (CIPRO) 500 MG tablet         Allergies  Sulfonamide derivatives  Electrocardiogram:   Afib 99 Low voltage nonspecific  ST/T wave changes  Assessment and Plan

## 2010-05-25 NOTE — Assessment & Plan Note (Signed)
Add Toprol for rate control.  Ok to hold coumadin 5 days before foot surgery with no Lovenox bridge

## 2010-05-25 NOTE — Patient Instructions (Signed)
Your physician recommends that you schedule a follow-up appointment in: 6 months with Dr. Eden Emms Your physician has recommended you make the following change in your medication: START TOPROL 50mg  by mouth daily.

## 2010-05-27 HISTORY — PX: BUNIONECTOMY: SHX129

## 2010-05-28 ENCOUNTER — Ambulatory Visit: Payer: Managed Care, Other (non HMO)

## 2010-06-01 ENCOUNTER — Ambulatory Visit
Admission: RE | Admit: 2010-06-01 | Discharge: 2010-06-01 | Disposition: A | Discharge status: Home / Self Care | Payer: Managed Care, Other (non HMO) | Source: Ambulatory Visit | Attending: Obstetrics and Gynecology | Admitting: Obstetrics and Gynecology

## 2010-06-01 DIAGNOSIS — Z1231 Encounter for screening mammogram for malignant neoplasm of breast: Secondary | ICD-10-CM

## 2010-06-12 ENCOUNTER — Encounter: Payer: Managed Care, Other (non HMO) | Admitting: *Deleted

## 2010-06-25 ENCOUNTER — Ambulatory Visit (INDEPENDENT_AMBULATORY_CARE_PROVIDER_SITE_OTHER): Payer: Managed Care, Other (non HMO) | Admitting: *Deleted

## 2010-06-25 DIAGNOSIS — Z8679 Personal history of other diseases of the circulatory system: Secondary | ICD-10-CM

## 2010-06-25 DIAGNOSIS — I4891 Unspecified atrial fibrillation: Secondary | ICD-10-CM

## 2010-06-25 LAB — POCT INR: INR: 2.7

## 2010-07-10 NOTE — Assessment & Plan Note (Signed)
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 Artis Flock for  consideration of screening colonoscopy.   Ms. Wingard really denies any GI complaints but has never had  colonoscopy screening.  She has regular bowel movements without melena  or hematochezia and denies abdominal pain, any upper GI or hepatobiliary  problems.  She is on a Weight Watcher's diet and is gradually losing  weight and denies anorexia.  She also denies any significant food  intolerances.  She has never had sigmoidoscopy, colonoscopy, or barium  enema studies.   Past medical history remarkable for hypertension and new-onset atrial  fibrillation over the last two years, which is managed with Dr. Artis Flock  and Dr. Eden Emms.  She never had a stroke or CVA.  She does have some skin  cancers which have been excised, has sleep apnea, had a tubal ligation  21 years ago.   MEDICATIONS:  1. Lipitor 20 mg a day for hyperlipidemia.  2. Zoloft 50 mg a day for depression.  3. Accupril 20 mg a day.  4. Varying Coumadin doses, as per Coumadin clinic.  5. Rythmol 225 mg a day.  6. Verapamil 120 mg a day.   She in the past has had reactions to SULFA medications.   FAMILY HISTORY:  Noncontributory.   SOCIAL HISTORY:  She is married and lives with her husband and daughter.  She has 12th grade education.  She smokes a quarter of a pack of  cigarettes per day but denies ethanol use.   REVIEW OF SYSTEMS:  Entirely negative without cardiovascular or  pulmonary complaints.  She otherwise has a negative review of systems.   PHYSICAL EXAMINATION:  She is a healthy-appearing white female appearing  her stated age in no  acute distress.  She is 5 feet 2 and weighs 193 pounds.  Blood pressure is 124/62, and  pulse was 80 and regular.  I could not appreciate stigmata of chronic liver disease.  Her chest was entirely clear.  She was in a regular rhythm without murmurs, rubs or gallops.  There was no hepatosplenomegaly, abdominal masses, or tenderness.  Bowel  sounds were normal.  Rectal exam was deferred.  Peripheral extremities were unremarkable.  Mental status was clear.   ASSESSMENT:  1. Need for screening colonoscopy because of her age and no previous      examination.  2. Well-controlled atrial fibrillation on chronic Coumadin therapy.  3. History of well-controlled essential hypertension.  4. Patient apparently is undergoing menopause with a last menstrual      period on December 16, 2006.   RECOMMENDATIONS:  I have gone ahead and set Ms. Molder up for  colonoscopy and will hold her Coumadin five days before this procedure  unless advised otherwise per Drs. Kindl or Nishan.  The risks and  benefits of  this procedure have been explained in detail, and she has  agreed to proceed as planned.  I have asked her otherwise to continue  all of her cardiac medications during her prep days and also the day of  her colonoscopy procedure to control her ventricular rate.  If her  colonoscopy is unremarkable, she will need to follow in our screening  protocol every 10 years unless otherwise indicated.     Vania Rea. Jarold Motto, MD, Caleen Essex, FAGA  Electronically Signed    DRP/MedQ  DD: 05/14/2007  DT: 05/14/2007  Job #: [id number]   cc:   Quita Skye. Artis Flock, M.D.  Noralyn Pick. Eden Emms, MD, [facility]  Dr. Amedeo Kinsman

## 2010-07-10 NOTE — Assessment & Plan Note (Signed)
[facility] HEALTHCARE                            CARDIOLOGY OFFICE NOTE   Linda, Ruiz                    MRN:          [id number]  DATE:09/25/2006                            DOB:          1950-09-19    Linda Ruiz returns today for followup. She has paroxysmal atrial  fibrillation. She is actually only on 225 of Rythmol once a day.   She denies any significant palpitations. Interestingly though, she does  not always know when she is in atrial fibrillation. She has been in it  before and been asymptomatic.   The patient has no significant coronary artery disease. She has good  left ventricular function.   Her Coumadin levels have been fine. She is to see the [facility]  today. She has not had any bleeding, diaphysis. There has been no  palpitations, syncope, chest pain, PND or orthopnea.   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 This was removed by Dr. Ashley Ruiz. Interestingly, her Coumadin was  not stopped for this procedure. She has a followup appointment with him  in October. Review of systems is otherwise negative.   PHYSICAL EXAMINATION:  Remarkable for an overweight middle-aged white  female in no distress. Affect is appropriate. Weight is 205. Blood  pressure 130/70. Pulse 78 and regular. Respiratory rate is 14. She is  afebrile.  HEENT: Is normal. Carotids normal without bruits. There is no JVP  elevation.  NECK: Shows no thyromegaly. No lymphadenopathy. Neck is otherwise  supple.  LUNGS:  Are clear. Good diaphragmatic motion. No wheezing.  There is an S1, S2 with normal heart sounds. PMI is normal.  ABDOMEN: Is benign. Bowel sounds positive. There is no AAA. No  hepatosplenomegaly. No hepatojugular reflux. No tenderness.  Distal pulses are intact. No edema.  NEURO: Nonfocal. There is no muscular weakness.   Her baseline EKG shows sinus rhythm with a QT interval in the low 400  range and poor  R-wave progression.   IMPRESSION:  1. Paroxysmal atrial fibrillation, maintaining sinus rhythm. Will      continue Rythmol and Coumadin.  2. Hypertension. Currently under good control. Continue verapamil and      low salt diet.  3. Hyperlipidemia. Followup lipid and liver profiles in 6 months.      Continue current dose of Lipitor 20 mg a day.   Overall, I think Linda Ruiz is doing fine. I will see her back in a years'  time.     Linda Ruiz. Linda Emms, MD, [facility]  Electronically Signed    PCN/MedQ  DD: 09/25/2006  DT: 09/25/2006  Job #: [id number]

## 2010-07-10 NOTE — Assessment & Plan Note (Signed)
[facility] HEALTHCARE                            CARDIOLOGY OFFICE NOTE   CRYSTLE, CARELLI                    MRN:          [id number]  DATE:10/12/2007                            DOB:          [birth year]    Tu returns today for followup.  She has done a wonderful job, losing  weight.  She has lost about 30 pounds.  She is counting calories.   She is in chronic AFib.   At one point, AFib and paroxysmal, she tends to be totally asymptomatic.  She has more problems taking off for work to check her Coumadin than  anything else.  I told her at this point, we would stop her Rhythmol,  since it is not effective and just put her on a higher dose of verapamil  for rate control and anticoagulation.  She seems comfortable with this  idea.  There is no previous history of coronary artery disease and she  has had good LV function.  We will talk to the research nurses to see if  we can get her enrolled in the engage trial or Coumadin alternatives.   REVIEW OF SYSTEMS:  Otherwise negative.  In particular, she is not  having palpitations, shortness of breath, chest pain or any bleeding  diathesis.   She is on:  1. Lipitor 20 a day.  2. Zoloft 50 a day.  3. Accupril 20 a day.  4. Coumadin as directed.  5. Verapamil to be increased to 240 a day.  6. Rhythmol to be stopped.   PHYSICAL EXAMINATION:  GENERAL:  Remarkable for a healthy-appearing  white female, who has lost 30 pounds.  VITAL SIGNS:  Weight is 172, blood pressure is 105/69, pulse 87 and  regular, respiratory rate 14 and afebrile.  HEENT:  Unremarkable.  NECK:  Carotids are without bruit.  No lymphadenopathy, thyromegaly, or  JVP elevation.  LUNGS:  Clear.  Good diaphragmatic motion.  No wheezing.  CARDIAC:  S1 and S2.  Normal heart sounds.  PMI normal.  ABDOMEN:  Benign.  Bowel sounds positive.  No AAA.  No tenderness.  No  bruit.  No hepatosplenomegaly or hepatojugular reflux.  No tenderness.  EXTREMITIES:  Distal pulses are intact.  No edema.  NEURO:  Nonfocal.  SKIN:  Warm and dry.  No muscular weakness.   EKG shows AFib with nonspecific ST-T wave changes.   IMPRESSION:  1. Atrial fibrillation, now chronic rate control and anticoagulation.      Stopped Rhythmol.  Increased verapamil.  2. Hypertension.  Currently, well-controlled.  Hopefully, the higher      dose of verapamil will not lower her blood pressure too much .  3. Diet and weight loss.  The patient has done an extremely good job.      I think she will keep it off.  She seems to have a system in place      to count her calories.  I congratulated her on this.  4. Hypercholesterolemia, also likely to be improved with significant      weight loss.  Continue Lipitor.  Lipid and liver profile  in 6      months.  5. History of anxiety and depression.  Continue Zoloft.  The patient's      mood is much better, since she feels so much better with her weight      loss.  She will continue her Zoloft as needed.   I will see her back in 6 months.     Noralyn Pick. Eden Emms, MD, [facility]  Electronically Signed    PCN/MedQ  DD: 10/12/2007  DT: 10/13/2007  Job #: [id number]

## 2010-07-13 NOTE — Op Note (Signed)
NAME:  Linda Ruiz, Linda Ruiz                       ACCOUNT NO.:  [id number]   MEDICAL RECORD NO.:  [id number]                   PATIENT TYPE:  AMB   LOCATION:  [facility]                                 FACILITY:  [facility]   PHYSICIAN:  Cynthia P. Romine, M.D.             DATE OF BIRTH:  [birth year]   DATE OF PROCEDURE:  02/28/2003  DATE OF DISCHARGE:                                 OPERATIVE REPORT   PREOPERATIVE DIAGNOSES:  1. Perimenopausal.  2. Abnormal bleeding.  3. Known endometrial polyps.   POSTOPERATIVE DIAGNOSES:  1. Perimenopausal.  2. Abnormal bleeding.  3. Known endometrial polyps.  4. Pathology pending.   PROCEDURES:  1. Hysteroscopic resection of endometrial polyps.  2. Dilatation and curettage.   SURGEON:  Cynthia P. Romine, M.D.   ANESTHESIA:  General by LMA.   ESTIMATED BLOOD LOSS:  Minimal.   ESTIMATED SORBITOL DEFICIT:  50 mL.   COMPLICATIONS:  None.   DESCRIPTION OF PROCEDURE:  The patient was taken to the operating room and  after induction of adequate general anesthesia by LMA was placed in the  dorsal lithotomy position and prepped and draped in the usual fashion.  The  bladder was drained with a red rubber catheter.  The cervix was grasped on  its anterior lip with a single-tooth tenaculum and was sounded to 9 cm.  The  cervix was dilated to a #27 Shawnie Pons.  Diagnostic hysteroscope was introduced.  Several endometrial polyps were noted as was a fairly lush endometrium, and  it was felt that resection would be indicated.  The diagnostic scope was  removed.  The cervix was dilated to a #31 Pratt.  The operative hysteroscope  was introduced, sorbitol was used as a distention medium at a pressure of 90  mmHg.  Endometrial polyps were resected with a single loop.  The resection  required several passes with the loop for removal of the polyp.  The  hysteroscope was removed.  Curettage was carried out.  The specimen was sent  to pathology.  The hysteroscope was  reintroduced.  There were several pieces  of loose tissue that were removed with the hysteroscope  and sent for pathology with the specimen, but otherwise the endometrial  cavity appeared clean.  Photographic documentation was taken.  The scope was  withdrawn, the instruments removed from the vagina, and the procedure was  terminated.                                               Cynthia P. Romine, M.D.    CPR/MEDQ  D:  02/28/2003  T:  02/28/2003  Job:  [id number]

## 2010-07-13 NOTE — Assessment & Plan Note (Signed)
[facility] HEALTHCARE                              CARDIOLOGY OFFICE NOTE   Linda Ruiz, Linda Ruiz                    MRN:          [id number]  DATE:10/30/2005                            DOB:          07/08/1950    Linda Ruiz returns today for follow-up.  She has had PAF.  She has been  maintained on verapamil and Rythmol.  She had a stress test a few months ago  with no proarrhythmia.   She has not had any significant palpitations, PND or orthopnea.   He Coumadin levels have been great.  She actually feels quite well.   PHYSICAL EXAMINATION:  VITAL SIGNS:  Blood pressure is 116/72, pulse 84 and  regular.  LUNGS:  Clear.  NECK:  Carotids are normal.  CARDIAC:  There is an S1, S2, with normal heart sounds.  ABDOMEN:  Benign.  EXTREMITIES:  Lower extremities with good distal pulses, no edema.   IMPRESSION:  Stable paroxysmal atrial fibrillation, currently maintained on  Rythmol and verapamil, asymptomatic.   Continue Coumadin.  I explained to the patient that she may be a candidate  for the ROCKET trial or some other Coumadin alternative in the future.  For  the time being I am pleased that she is asymptomatic and seems to be in  sinus rhythm.   I will see her back in about 3 months, and we will continue to monitor her  rhythm.  It was interesting that she actually was in sinus rhythm and went  into atrial fibrillation during her stress test.                               Theron Arista C. Eden Emms, MD, [facility]    PCN/MedQ  DD:  10/30/2005  DT:  10/30/2005  Job #:  [id number]

## 2010-07-15 ENCOUNTER — Other Ambulatory Visit: Payer: Self-pay | Admitting: Cardiovascular Disease

## 2010-07-20 ENCOUNTER — Ambulatory Visit (INDEPENDENT_AMBULATORY_CARE_PROVIDER_SITE_OTHER): Payer: Managed Care, Other (non HMO) | Admitting: *Deleted

## 2010-07-20 DIAGNOSIS — I4891 Unspecified atrial fibrillation: Secondary | ICD-10-CM

## 2010-07-20 DIAGNOSIS — Z8679 Personal history of other diseases of the circulatory system: Secondary | ICD-10-CM

## 2010-07-24 ENCOUNTER — Other Ambulatory Visit: Payer: Self-pay | Admitting: *Deleted

## 2010-07-26 ENCOUNTER — Encounter: Payer: Managed Care, Other (non HMO) | Admitting: *Deleted

## 2010-08-17 ENCOUNTER — Encounter: Payer: Managed Care, Other (non HMO) | Admitting: *Deleted

## 2010-08-21 ENCOUNTER — Ambulatory Visit (INDEPENDENT_AMBULATORY_CARE_PROVIDER_SITE_OTHER): Payer: Managed Care, Other (non HMO) | Admitting: *Deleted

## 2010-08-21 DIAGNOSIS — Z8679 Personal history of other diseases of the circulatory system: Secondary | ICD-10-CM

## 2010-08-21 DIAGNOSIS — I4891 Unspecified atrial fibrillation: Secondary | ICD-10-CM

## 2010-09-18 ENCOUNTER — Ambulatory Visit (INDEPENDENT_AMBULATORY_CARE_PROVIDER_SITE_OTHER): Payer: Managed Care, Other (non HMO) | Admitting: *Deleted

## 2010-09-18 DIAGNOSIS — Z8679 Personal history of other diseases of the circulatory system: Secondary | ICD-10-CM

## 2010-09-18 DIAGNOSIS — I4891 Unspecified atrial fibrillation: Secondary | ICD-10-CM

## 2010-10-02 ENCOUNTER — Ambulatory Visit (INDEPENDENT_AMBULATORY_CARE_PROVIDER_SITE_OTHER): Payer: Managed Care, Other (non HMO) | Admitting: *Deleted

## 2010-10-02 DIAGNOSIS — Z8679 Personal history of other diseases of the circulatory system: Secondary | ICD-10-CM

## 2010-10-02 DIAGNOSIS — I4891 Unspecified atrial fibrillation: Secondary | ICD-10-CM

## 2010-10-23 ENCOUNTER — Ambulatory Visit (INDEPENDENT_AMBULATORY_CARE_PROVIDER_SITE_OTHER): Payer: Managed Care, Other (non HMO) | Admitting: *Deleted

## 2010-10-23 DIAGNOSIS — Z8679 Personal history of other diseases of the circulatory system: Secondary | ICD-10-CM

## 2010-10-23 DIAGNOSIS — I4891 Unspecified atrial fibrillation: Secondary | ICD-10-CM

## 2010-10-30 ENCOUNTER — Encounter: Payer: Self-pay | Admitting: Cardiovascular Disease

## 2010-10-30 ENCOUNTER — Other Ambulatory Visit: Payer: Self-pay | Admitting: Cardiovascular Disease

## 2010-10-30 ENCOUNTER — Ambulatory Visit (INDEPENDENT_AMBULATORY_CARE_PROVIDER_SITE_OTHER): Payer: Managed Care, Other (non HMO) | Admitting: Cardiovascular Disease

## 2010-10-30 DIAGNOSIS — E782 Mixed hyperlipidemia: Secondary | ICD-10-CM

## 2010-10-30 DIAGNOSIS — I10 Essential (primary) hypertension: Secondary | ICD-10-CM

## 2010-10-30 DIAGNOSIS — I4891 Unspecified atrial fibrillation: Secondary | ICD-10-CM

## 2010-10-30 NOTE — Patient Instructions (Signed)
Your physician wants you to follow-up in: ONE YEAR You will receive a reminder letter in the mail two months in advance. If you don't receive a letter, please call our office to schedule the follow-up appointment.  

## 2010-10-30 NOTE — Assessment & Plan Note (Signed)
Cholesterol is at goal.  Continue current dose of statin and diet Rx.  No myalgias or side effects.  F/U  LFT's in 6 months. Lab Results  Component Value Date   LDLCALC 125* 04/06/2008             

## 2010-10-30 NOTE — Progress Notes (Signed)
Linda Ruiz is seen today for F/U of her chronic afib, hypertension. She is asymptomatic with no palpitations, PNC, orthopnea, diaphoresis or syncope. She continues to work in [employer]	[place]. Her coumadin has been therapeutic She was thinking of changing to Pradaxa.  However with history of hematuria and no antidote I told her I probably would not.  Sister still terminally ill.  Had foot surgery with Dr Lanice Schwab with good results.  Stopped coumadin with no lovenox and did fine  ROS: Denies fever, malais, weight loss, blurry vision, decreased visual acuity, cough, sputum, SOB, hemoptysis, pleuritic pain, palpitaitons, heartburn, abdominal pain, melena, lower extremity edema, claudication, or rash.  All other systems reviewed and negative  General: Affect appropriate Healthy:  appears stated age HEENT: normal Neck supple with no adenopathy JVP normal no bruits no thyromegaly Lungs clear with no wheezing and good diaphragmatic motion Heart:  S1/S2 no murmur,rub, gallop or click PMI normal Abdomen: benighn, BS positve, no tenderness, no AAA no bruit.  No HSM or HJR Distal pulses intact with no bruits No edema Neuro non-focal Skin warm and dry No muscular weakness   Current Outpatient Prescriptions  Medication Sig Dispense Refill  . atorvastatin (LIPITOR) 20 MG tablet Take 20 mg by mouth daily.        . metoprolol (TOPROL-XL) 50 MG 24 hr tablet Take 1 tablet (50 mg total) by mouth daily.  30 tablet  8  . quinapril (ACCUPRIL) 20 MG tablet Take 20 mg by mouth at bedtime.        . sertraline (ZOLOFT) 50 MG tablet Take 50 mg by mouth daily.        . verapamil (CALAN-SR) 240 MG CR tablet take 1 capsule by mouth once daily  30 tablet  3  . warfarin (COUMADIN) 5 MG tablet as directed PER ANTICOAGULATION CLINIC  45 tablet  3    Allergies  Sulfonamide derivatives  Electrocardiogram:  Assessment and Plan

## 2010-10-30 NOTE — Assessment & Plan Note (Signed)
Rate control improved with beta blocker and headaches gone.  Encouraged her to stay on coumadin and not change to Pradaxa for now

## 2010-10-30 NOTE — Assessment & Plan Note (Signed)
Well controlled.  Continue current medications and low sodium Dash type diet.    

## 2010-11-10 ENCOUNTER — Other Ambulatory Visit: Payer: Self-pay | Admitting: Cardiovascular Disease

## 2010-11-20 ENCOUNTER — Ambulatory Visit (INDEPENDENT_AMBULATORY_CARE_PROVIDER_SITE_OTHER): Payer: Managed Care, Other (non HMO) | Admitting: *Deleted

## 2010-11-20 DIAGNOSIS — Z8679 Personal history of other diseases of the circulatory system: Secondary | ICD-10-CM

## 2010-11-20 DIAGNOSIS — I4891 Unspecified atrial fibrillation: Secondary | ICD-10-CM

## 2010-11-20 LAB — POCT INR: INR: 2.6

## 2010-11-27 LAB — PROTIME-INR
INR: 1.9 — ABNORMAL HIGH
Prothrombin Time: 23.3 — ABNORMAL HIGH

## 2010-11-27 LAB — POCT CARDIAC MARKERS
CKMB, poc: <1 — ABNORMAL LOW
Myoglobin, poc: 61.5

## 2010-12-18 ENCOUNTER — Ambulatory Visit (INDEPENDENT_AMBULATORY_CARE_PROVIDER_SITE_OTHER): Payer: Managed Care, Other (non HMO) | Admitting: *Deleted

## 2010-12-18 DIAGNOSIS — I4891 Unspecified atrial fibrillation: Secondary | ICD-10-CM

## 2010-12-18 DIAGNOSIS — Z8679 Personal history of other diseases of the circulatory system: Secondary | ICD-10-CM

## 2010-12-18 DIAGNOSIS — Z7901 Long term (current) use of anticoagulants: Secondary | ICD-10-CM

## 2011-01-29 ENCOUNTER — Ambulatory Visit (INDEPENDENT_AMBULATORY_CARE_PROVIDER_SITE_OTHER): Payer: Managed Care, Other (non HMO) | Admitting: *Deleted

## 2011-01-29 DIAGNOSIS — Z8679 Personal history of other diseases of the circulatory system: Secondary | ICD-10-CM

## 2011-01-29 DIAGNOSIS — I4891 Unspecified atrial fibrillation: Secondary | ICD-10-CM

## 2011-01-29 DIAGNOSIS — Z7901 Long term (current) use of anticoagulants: Secondary | ICD-10-CM

## 2011-02-22 ENCOUNTER — Other Ambulatory Visit: Payer: Self-pay | Admitting: Cardiovascular Disease

## 2011-03-10 ENCOUNTER — Other Ambulatory Visit: Payer: Self-pay | Admitting: Cardiovascular Disease

## 2011-03-11 ENCOUNTER — Other Ambulatory Visit: Payer: Self-pay

## 2011-03-11 MED ORDER — VERAPAMIL HCL ER 240 MG PO TBCR: 240.0000 mg | EXTENDED_RELEASE_TABLET | Freq: Every day | ORAL | Status: DC

## 2011-03-12 ENCOUNTER — Ambulatory Visit (INDEPENDENT_AMBULATORY_CARE_PROVIDER_SITE_OTHER): Payer: Managed Care, Other (non HMO) | Admitting: *Deleted

## 2011-03-12 DIAGNOSIS — I4891 Unspecified atrial fibrillation: Secondary | ICD-10-CM

## 2011-03-12 DIAGNOSIS — Z8679 Personal history of other diseases of the circulatory system: Secondary | ICD-10-CM

## 2011-03-12 DIAGNOSIS — Z7901 Long term (current) use of anticoagulants: Secondary | ICD-10-CM

## 2011-03-12 LAB — POCT INR: INR: 2.4

## 2011-04-22 ENCOUNTER — Other Ambulatory Visit: Payer: Self-pay | Admitting: Cardiovascular Disease

## 2011-04-23 ENCOUNTER — Ambulatory Visit (INDEPENDENT_AMBULATORY_CARE_PROVIDER_SITE_OTHER): Payer: Managed Care, Other (non HMO)

## 2011-04-23 DIAGNOSIS — Z8679 Personal history of other diseases of the circulatory system: Secondary | ICD-10-CM

## 2011-04-23 DIAGNOSIS — Z7901 Long term (current) use of anticoagulants: Secondary | ICD-10-CM

## 2011-04-23 DIAGNOSIS — I4891 Unspecified atrial fibrillation: Secondary | ICD-10-CM

## 2011-04-23 LAB — POCT INR: INR: 2.5

## 2011-05-27 DIAGNOSIS — N891 Moderate vaginal dysplasia: Secondary | ICD-10-CM

## 2011-05-27 HISTORY — DX: Moderate vaginal dysplasia: N89.1

## 2011-06-04 ENCOUNTER — Ambulatory Visit (INDEPENDENT_AMBULATORY_CARE_PROVIDER_SITE_OTHER): Payer: Managed Care, Other (non HMO) | Admitting: *Deleted

## 2011-06-04 DIAGNOSIS — Z8679 Personal history of other diseases of the circulatory system: Secondary | ICD-10-CM

## 2011-06-04 DIAGNOSIS — Z7901 Long term (current) use of anticoagulants: Secondary | ICD-10-CM

## 2011-06-04 DIAGNOSIS — I4891 Unspecified atrial fibrillation: Secondary | ICD-10-CM

## 2011-06-05 ENCOUNTER — Encounter: Payer: Self-pay | Admitting: Gastroenterology

## 2011-06-05 ENCOUNTER — Encounter: Payer: Self-pay | Admitting: *Deleted

## 2011-06-05 ENCOUNTER — Telehealth: Payer: Self-pay

## 2011-06-05 NOTE — Telephone Encounter (Signed)
Message copied by Jessee Avers on Wed Jun 05, 2011  3:04 PM ------      Message from: [place], [place] E      Created: Wed Jun 05, 2011  2:29 PM       PT MAY HOLD COUMADIN 4-5 DAYS PRIOR TO COLONOSCOPY PER DR Eden Emms.      CHRISTINE

## 2011-06-05 NOTE — Telephone Encounter (Signed)
Pt notified to come off coumadin 5 days before there procedure per Dr. Eden Emms. Pt verbalized understanding.

## 2011-06-06 ENCOUNTER — Encounter: Payer: Self-pay | Admitting: Gastroenterology

## 2011-06-25 ENCOUNTER — Ambulatory Visit: Payer: Managed Care, Other (non HMO) | Admitting: Gastroenterology

## 2011-06-25 ENCOUNTER — Ambulatory Visit (INDEPENDENT_AMBULATORY_CARE_PROVIDER_SITE_OTHER): Payer: Managed Care, Other (non HMO)

## 2011-06-25 DIAGNOSIS — Z8679 Personal history of other diseases of the circulatory system: Secondary | ICD-10-CM

## 2011-06-25 DIAGNOSIS — Z7901 Long term (current) use of anticoagulants: Secondary | ICD-10-CM

## 2011-06-25 DIAGNOSIS — I4891 Unspecified atrial fibrillation: Secondary | ICD-10-CM

## 2011-06-25 NOTE — Patient Instructions (Signed)
Resume Coumadin per MD, take 1.5 tablets x 2 doses, then resume same dosage 1 tablet daily except 1.5 tablets on Mondays.

## 2011-06-26 ENCOUNTER — Encounter: Payer: Self-pay | Admitting: Gynecologic Oncology

## 2011-06-26 ENCOUNTER — Ambulatory Visit: Payer: Managed Care, Other (non HMO) | Attending: Gynecologic Oncology | Admitting: Gynecologic Oncology

## 2011-06-26 VITALS — BP 102/70 | HR 64 | Temp 98.2°F | Resp 16 | Ht 62.44 in | Wt 200.0 lb

## 2011-06-26 DIAGNOSIS — D071 Carcinoma in situ of vulva: Secondary | ICD-10-CM | POA: Insufficient documentation

## 2011-06-26 DIAGNOSIS — Z8673 Personal history of transient ischemic attack (TIA), and cerebral infarction without residual deficits: Secondary | ICD-10-CM | POA: Insufficient documentation

## 2011-06-26 DIAGNOSIS — I10 Essential (primary) hypertension: Secondary | ICD-10-CM | POA: Insufficient documentation

## 2011-06-26 DIAGNOSIS — I4891 Unspecified atrial fibrillation: Secondary | ICD-10-CM | POA: Insufficient documentation

## 2011-06-26 DIAGNOSIS — E785 Hyperlipidemia, unspecified: Secondary | ICD-10-CM | POA: Insufficient documentation

## 2011-06-26 DIAGNOSIS — Z79899 Other long term (current) drug therapy: Secondary | ICD-10-CM | POA: Insufficient documentation

## 2011-06-26 DIAGNOSIS — G4730 Sleep apnea, unspecified: Secondary | ICD-10-CM | POA: Insufficient documentation

## 2011-06-26 NOTE — Progress Notes (Signed)
Consult Note: Gyn-Onc  Linda Ruiz 61 y.o. female  CC:  Chief Complaint  Patient presents with  . Recurrent VIN III    New pt    HPI: Patient is seen today in consultation at the request of Dr. Tresa Res.   Patient is a 61 year old gravida 1 para 1 who was seen by Dr. Tawanna Cooler On April 3. This was the time of her annual examination. At that time a lesion was noted on the right inferior aspect of the fascia. A small excision was done in the office of the lesion. It revealed VIN-III. She comes in today for evaluation of this.the patient states she has a history of vulvar dysplasia back in 2008 at which time she had a wide local excision. I do not know what the margin status was. The patient has never been symptomatic from this. She states that Dr. Tresa Res as noticed the lesion at each of the time of her annual exams. She denies any bleeding pruritus or pain.  Interval History:  When she saw her primary physician Dr. Lelon Frohlich she was noted to have microscopic hematuria. She has recently seen a urologist. CT scan is pending as is her cystoscopy. The urine culture was negative. She denies any other complaints. Does have occasional stress urinary incontinence with coughing. She denies any change in her bowel or bladder habits otherwise. Review of Systems  Current Meds:  Outpatient Encounter Prescriptions as of 06/26/2011  Medication Sig Dispense Refill  . atorvastatin (LIPITOR) 20 MG tablet Take 20 mg by mouth daily.        . metoprolol (TOPROL-XL) 50 MG 24 hr tablet take 1 tablet by mouth once daily  30 tablet  8  . quinapril (ACCUPRIL) 20 MG tablet Take 20 mg by mouth at bedtime.        . sertraline (ZOLOFT) 50 MG tablet Take 50 mg by mouth daily.        . verapamil (CALAN-SR) 240 MG CR tablet Take 1 tablet (240 mg total) by mouth at bedtime.  30 tablet  9  . warfarin (COUMADIN) 5 MG tablet as directed PER ANTICOAGULATION CLINIC  45 tablet  3    Allergy:  Allergies  Allergen Reactions  .  Sulfonamide Derivatives Rash    Social Hx:   History   Social History  . Marital Status: Married    Spouse Name: N/A    Number of Children: N/A  . Years of Education: N/A   Occupational History  . customer service    Social History Main Topics  . Smoking status: Current Everyday Smoker -- 0.5 packs/day for 8 years    Types: Cigarettes  . Smokeless tobacco: Not on file  . Alcohol Use: Yes     Rare  . Drug Use: No  . Sexually Active: Not Currently   Other Topics Concern  . Not on file   Social History Narrative  . No narrative on file    Past Surgical Hx:  Past Surgical History  Procedure Date  . Cesarean section   . Tubaligation   . Polypectomy     endometrial polyp removed 2005 D&C  . Foot surgery 05/2010    Left  . Skin cancer removal     this past year and five years ago    Past Medical Hx:  Past Medical History  Diagnosis Date  . Atrial fibrillation   . Hypertension   . Hyperlipidemia   . Anxiety   . CVA (cerebral infarction)   .  Skin cancer   . Sleep apnea     Family Hx:  Family History  Problem Relation Age of Onset  . Cancer      mother and father    Vitals:  Blood pressure 102/70, pulse 64, temperature 98.2 F (36.8 C), resp. rate 16, height 5' 2.44" (1.586 m), weight 200 lb (90.719 kg).  Physical Exam:  Well-nourished well-developed female in no acute distress.  Neck: No lymphadenopathy no thyromegaly.  Lungs: Clear to auscultation bilaterally. The cardiovascular: Regular rate and rhythm.  Abdomen: Soft, nondistended, nondistended.  Groins: No lymphadenopathy.  Extremities: Surgical incision left foot. No edema.  Pelvic: External genitalia notable for a white placed hyperkeratotic area in the right labia majora. It measures approximately 5 x 2 mm. There are no other visible lesions Assessment/Plan: 61 year old gravida 1 para 1 with VIN-III. I believe this area and it should be excised completely and I do believe we can do that  in the office. He'll be scheduled for that. She does not necessarily have to come off of her Coumadin for that minor procedure. She has her cystoscopy the same day and I would defer to her urologist regarding this. We also discussed smoking cessation and the association with smoking and vulvar dysplasia. She states she was not aware of this. She had quit smoking for many years she has approximately 15 years total smoking but recently picked up in the last 8. She will consider smoking cessation programs.  Elliotte Marsalis A., MD 06/26/2011, 11:03 AM

## 2011-06-26 NOTE — Patient Instructions (Signed)
RTC for procedure

## 2011-07-03 ENCOUNTER — Encounter: Payer: Self-pay | Admitting: Gastroenterology

## 2011-07-03 ENCOUNTER — Ambulatory Visit (AMBULATORY_SURGERY_CENTER): Payer: Managed Care, Other (non HMO) | Admitting: *Deleted

## 2011-07-03 VITALS — Ht 62.0 in | Wt 199.6 lb

## 2011-07-03 DIAGNOSIS — Z1211 Encounter for screening for malignant neoplasm of colon: Secondary | ICD-10-CM

## 2011-07-03 DIAGNOSIS — Z86010 Personal history of colonic polyps: Secondary | ICD-10-CM

## 2011-07-03 MED ORDER — PEG-KCL-NACL-NASULF-NA ASC-C 100 G PO SOLR: 1.0000 | Freq: Once | ORAL | Status: DC

## 2011-07-10 ENCOUNTER — Ambulatory Visit: Payer: Managed Care, Other (non HMO) | Admitting: Gynecologic Oncology

## 2011-07-10 ENCOUNTER — Encounter: Payer: Self-pay | Admitting: Gynecologic Oncology

## 2011-07-10 ENCOUNTER — Ambulatory Visit: Payer: Managed Care, Other (non HMO) | Attending: Gynecologic Oncology | Admitting: Gynecologic Oncology

## 2011-07-10 VITALS — BP 110/64 | HR 66 | Temp 97.7°F | Resp 16 | Ht 62.44 in | Wt 199.1 lb

## 2011-07-10 DIAGNOSIS — I10 Essential (primary) hypertension: Secondary | ICD-10-CM | POA: Insufficient documentation

## 2011-07-10 DIAGNOSIS — Z85828 Personal history of other malignant neoplasm of skin: Secondary | ICD-10-CM | POA: Insufficient documentation

## 2011-07-10 DIAGNOSIS — L821 Other seborrheic keratosis: Secondary | ICD-10-CM | POA: Insufficient documentation

## 2011-07-10 DIAGNOSIS — Z8673 Personal history of transient ischemic attack (TIA), and cerebral infarction without residual deficits: Secondary | ICD-10-CM | POA: Insufficient documentation

## 2011-07-10 DIAGNOSIS — D071 Carcinoma in situ of vulva: Secondary | ICD-10-CM | POA: Insufficient documentation

## 2011-07-10 DIAGNOSIS — F17200 Nicotine dependence, unspecified, uncomplicated: Secondary | ICD-10-CM | POA: Insufficient documentation

## 2011-07-10 DIAGNOSIS — Z79899 Other long term (current) drug therapy: Secondary | ICD-10-CM | POA: Insufficient documentation

## 2011-07-10 DIAGNOSIS — Z7901 Long term (current) use of anticoagulants: Secondary | ICD-10-CM | POA: Insufficient documentation

## 2011-07-10 DIAGNOSIS — E785 Hyperlipidemia, unspecified: Secondary | ICD-10-CM | POA: Insufficient documentation

## 2011-07-10 DIAGNOSIS — G4730 Sleep apnea, unspecified: Secondary | ICD-10-CM | POA: Insufficient documentation

## 2011-07-10 NOTE — Patient Instructions (Signed)
Perineal care as instructed.

## 2011-07-10 NOTE — Progress Notes (Signed)
Consult Note: Gyn-Onc  [name] 61 y.o. female  CC:  Chief Complaint  Patient presents with  . VIN III    Follow up    HPI:  Patient is a 61 year old gravida 1 para 1 who was seen by Dr. Tresa Res on April 3. This was the time of her annual examination. At that time a lesion was noted on the right inferior aspect of the vulva. A small excision was done in the office of the lesion. It revealed VIN-III. She comes in today for evaluation of this. The patient states she has a history of vulvar dysplasia back in 2008 at which time she had a wide local excision. I do not know what the margin status was. The patient has never been symptomatic from this. She states that Dr. Tresa Res as noticed the lesion at each of the time of her annual exams. She denies any bleeding pruritus or pain.   Interval History:  When she saw her primary physician Dr. Lelon Frohlich she was noted to have microscopic hematuria. She has recently seen a urologist. CT scan was negative and per the patient, her cystoscopy was negative today as well. The urine culture was negative. She denies any other complaints. Does have occasional stress urinary incontinence with coughing. She denies any change in her bowel or bladder habits otherwise.  Interval History:   Review of Systems  Current Meds:  Outpatient Encounter Prescriptions as of 07/10/2011  Medication Sig Dispense Refill  . atorvastatin (LIPITOR) 20 MG tablet Take 20 mg by mouth daily.        . metoprolol (TOPROL-XL) 50 MG 24 hr tablet take 1 tablet by mouth once daily  30 tablet  8  . peg 3350 powder (MOVIPREP) 100 G SOLR Take 1 kit (100 g total) by mouth once.  1 kit  0  . quinapril (ACCUPRIL) 20 MG tablet Take 20 mg by mouth at bedtime.        . sertraline (ZOLOFT) 50 MG tablet Take 50 mg by mouth daily.        . verapamil (CALAN-SR) 240 MG CR tablet Take 1 tablet (240 mg total) by mouth at bedtime.  30 tablet  9  . warfarin (COUMADIN) 5 MG tablet as directed PER  ANTICOAGULATION CLINIC  45 tablet  3    Allergy:  Allergies  Allergen Reactions  . Sulfonamide Derivatives Rash    Social Hx:   History   Social History  . Marital Status: Married    Spouse Name: N/A    Number of Children: N/A  . Years of Education: N/A   Occupational History  . customer service    Social History Main Topics  . Smoking status: Current Everyday Smoker -- 0.5 packs/day for 8 years    Types: Cigarettes  . Smokeless tobacco: Never Used  . Alcohol Use: Yes     Rare  . Drug Use: No  . Sexually Active: Not Currently   Other Topics Concern  . Not on file   Social History Narrative  . No narrative on file    Past Surgical Hx:  Past Surgical History  Procedure Date  . Cesarean section   . Tubaligation   . Polypectomy     endometrial polyp removed 2005 D&C  . Foot surgery 05/2010    Left  . Skin cancer removal     this past year and five years ago    Past Medical Hx:  Past Medical History  Diagnosis Date  .  Atrial fibrillation   . Hypertension   . Hyperlipidemia   . Anxiety   . CVA (cerebral infarction)   . Skin cancer   . Sleep apnea     Family Hx:  Family History  Problem Relation Age of Onset  . Cancer      mother and father  . Colon cancer Neg Hx   . Esophageal cancer Neg Hx   . Rectal cancer Neg Hx   . Stomach cancer Neg Hx     Vitals:  Blood pressure 110/64, pulse 66, temperature 97.7 F (36.5 C), temperature source Oral, resp. rate 16, height 5' 2.44" (1.586 m), weight 199 lb 1.6 oz (90.311 kg).  Physical Exam:  Verbal consent was obtained to proceed with a wide local excision of the vulva.  Patient was placed in the dorsolithotomy position. The small lesion on the inferior aspect of the right labia majora was identified. 1 cc of 1% lidocaine was injected. The lesion was picked up in using Metzenbaum scissors was completely excised. 2 sutures of 3-0 Vicryl were used for hemostasis and reapproximation of the skin. The patient  tolerated the procedure well the Assessment/Plan:  61 year old with VIN 3.  We will followup in results of her pathology today. She was given instructions regarding perineal care. She'll return to see me in a few weeks for a wound check. Avraj Lindroth A., MD 07/10/2011, 2:55 PM

## 2011-07-12 ENCOUNTER — Telehealth: Payer: Self-pay | Admitting: Gynecologic Oncology

## 2011-07-12 NOTE — Telephone Encounter (Signed)
Pt notified about biopsy results.  No concerns or questions voiced.  Follow up appointment arranged for 08/15/11 at 8:30 am.  Instructed to call for any questions or concerns.

## 2011-07-17 ENCOUNTER — Encounter: Payer: Self-pay | Admitting: Gastroenterology

## 2011-07-17 ENCOUNTER — Ambulatory Visit (AMBULATORY_SURGERY_CENTER): Payer: Managed Care, Other (non HMO) | Admitting: Gastroenterology

## 2011-07-17 VITALS — BP 126/80 | HR 99 | Temp 96.0°F | Resp 20 | Ht 62.0 in | Wt 199.0 lb

## 2011-07-17 DIAGNOSIS — D126 Benign neoplasm of colon, unspecified: Secondary | ICD-10-CM

## 2011-07-17 DIAGNOSIS — Z1211 Encounter for screening for malignant neoplasm of colon: Secondary | ICD-10-CM

## 2011-07-17 DIAGNOSIS — Z860100 Personal history of colon polyps, unspecified: Secondary | ICD-10-CM

## 2011-07-17 DIAGNOSIS — K5730 Diverticulosis of large intestine without perforation or abscess without bleeding: Secondary | ICD-10-CM

## 2011-07-17 MED ORDER — SODIUM CHLORIDE 0.9 % IV SOLN: 500.0000 mL | INTRAVENOUS | Status: DC

## 2011-07-17 NOTE — Op Note (Signed)
[facility] [address] [place], [place]  [address]  COLONOSCOPY PROCEDURE REPORT  PATIENT:  Linda Ruiz, Linda Ruiz  MR#:  [id number] BIRTHDATE:  05/15/1950, 60 yrs. old  GENDER:  female ENDOSCOPIST:  Vania Rea. Jarold Motto, MD, [facility] REF. BY: PROCEDURE DATE:  07/17/2011 PROCEDURE:  Colonoscopy with snare polypectomy ASA CLASS:  Class II INDICATIONS:  history of pre-cancerous (adenomatous) colon polyps  MEDICATIONS:   propofol (Diprivan) 350 mg IV  DESCRIPTION OF PROCEDURE:   After the risks and benefits and of the procedure were explained, informed consent was obtained. Digital rectal exam was performed and revealed perianal skin tags. The  endoscope was introduced through the anus and advanced to the cecum, which was identified by both the appendix and ileocecal valve.  The quality of the prep was good, using MoviPrep.  The instrument was then slowly withdrawn as the colon was fully examined. <<PROCEDUREIMAGES>>  FINDINGS:  Severe diverticulosis was found in the sigmoid to descending colon segments.  There were multiple polyps identified and removed. in the sigmoid to descending colon segments. multiple left flat polyps hot snare removed.greater than3.very spastic colon !!!   Retroflexed views in the rectum revealed no abnormalities.    The scope was then withdrawn from the patient and the procedure completed.  COMPLICATIONS:  None ENDOSCOPIC IMPRESSION: 1) Severe diverticulosis in the sigmoid to descending colon segments 2) Diverticulosis 3) Sessile polyp RECOMMENDATIONS: 1) Await pathology results 2) Repeat Colonoscopy in 3 years. 3) High fiber diet resume coumadin in 1 week  REPEAT EXAM:  No  ______________________________ Vania Rea. Jarold Motto, MD, [facility]  CC:  Geoffry Paradise, MD  n. Rosalie Doctor:   Vania Rea. Deashia Soule at 07/17/2011 09:17 AM  Loree Fee, [phone]

## 2011-07-17 NOTE — Progress Notes (Signed)
Pt is in atrial fibrillation   Propofol given per Paulita Cradle CRNA- oxygen managed per CRNA.

## 2011-07-17 NOTE — Progress Notes (Signed)
Patient did not experience any of the following events: a burn prior to discharge; a fall within the facility; wrong site/side/patient/procedure/implant event; or a hospital transfer or hospital admission upon discharge from the facility. (G8907) Patient did not have preoperative order for IV antibiotic SSI prophylaxis. (G8918)  

## 2011-07-17 NOTE — Patient Instructions (Signed)
Impressions/recommendations:  Diverticulosis (handout given) Polyp (handout given) High Fiber Diet (handout given)  Repeat colonoscopy in 3 years.  Resume coumadin in 1 week 07/24/11.  YOU HAD AN ENDOSCOPIC PROCEDURE TODAY AT THE [facility]: Refer to the procedure report that was given to you for any specific questions about what was found during the examination.  If the procedure report does not answer your questions, please call your gastroenterologist to clarify.  If you requested that your care partner not be given the details of your procedure findings, then the procedure report has been included in a sealed envelope for you to review at your convenience later.  YOU SHOULD EXPECT: Some feelings of bloating in the abdomen. Passage of more gas than usual.  Walking can help get rid of the air that was put into your GI tract during the procedure and reduce the bloating. If you had a lower endoscopy (such as a colonoscopy or flexible sigmoidoscopy) you may notice spotting of blood in your stool or on the toilet paper. If you underwent a bowel prep for your procedure, then you may not have a normal bowel movement for a few days.  DIET: Your first meal following the procedure should be a light meal and then it is ok to progress to your normal diet.  A half-sandwich or bowl of soup is an example of a good first meal.  Heavy or fried foods are harder to digest and may make you feel nauseous or bloated.  Likewise meals heavy in dairy and vegetables can cause extra gas to form and this can also increase the bloating.  Drink plenty of fluids but you should avoid alcoholic beverages for 24 hours.  ACTIVITY: Your care partner should take you home directly after the procedure.  You should plan to take it easy, moving slowly for the rest of the day.  You can resume normal activity the day after the procedure however you should NOT DRIVE or use heavy machinery for 24 hours (because of the sedation  medicines used during the test).    SYMPTOMS TO REPORT IMMEDIATELY: A gastroenterologist can be reached at any hour.  During normal business hours, 8:30 AM to 5:00 PM Monday through Friday, call [phone].  After hours and on weekends, please call the GI answering service at [phone] who will take a message and have the physician on call contact you.   Following lower endoscopy (colonoscopy or flexible sigmoidoscopy):  Excessive amounts of blood in the stool  Significant tenderness or worsening of abdominal pains  Swelling of the abdomen that is new, acute  Fever of 100F or higher  Following upper endoscopy (EGD)  Vomiting of blood or coffee ground material  New chest pain or pain under the shoulder blades  Painful or persistently difficult swallowing  New shortness of breath  Fever of 100F or higher  Black, tarry-looking stools  FOLLOW UP: If any biopsies were taken you will be contacted by phone or by letter within the next 1-3 weeks.  Call your gastroenterologist if you have not heard about the biopsies in 3 weeks.  Our staff will call the home number listed on your records the next business day following your procedure to check on you and address any questions or concerns that you may have at that time regarding the information given to you following your procedure. This is a courtesy call and so if there is no answer at the home number and we have not heard from  you through the emergency physician on call, we will assume that you have returned to your regular daily activities without incident.  SIGNATURES/CONFIDENTIALITY: You and/or your care partner have signed paperwork which will be entered into your electronic medical record.  These signatures attest to the fact that that the information above on your After Visit Summary has been reviewed and is understood.  Full responsibility of the confidentiality of this discharge information lies with you and/or your care-partner.

## 2011-07-18 ENCOUNTER — Telehealth: Payer: Self-pay

## 2011-07-18 ENCOUNTER — Telehealth: Payer: Self-pay | Admitting: *Deleted

## 2011-07-18 NOTE — Telephone Encounter (Signed)
  Follow up Call-  Call back number 07/17/2011  Post procedure Call Back phone  # [phone]  Permission to leave phone message Yes     Patient questions:  Do you have a fever, pain , or abdominal swelling? no Pain Score  0 *  Have you tolerated food without any problems? yes  Have you been able to return to your normal activities? yes  Do you have any questions about your discharge instructions: Diet   no Medications  no Follow up visit  no  Do you have questions or concerns about your Care? no  Actions: * If pain score is 4 or above: No action needed, pain <4.

## 2011-07-18 NOTE — Telephone Encounter (Signed)
Pt called because she has been off her coumadin for 7 days prior to her colonoscopy. She had some polyps removed and they want her to be off her coumadin for 7 more days. She was told to call us to get the okay. Will discuss with DOD.

## 2011-07-18 NOTE — Telephone Encounter (Signed)
Discussed with dr Jens Som (DOD), okay given for pt to remain off coumadin for 7 more days. Coumadin appt rescheduled.

## 2011-07-24 ENCOUNTER — Encounter: Payer: Self-pay | Admitting: Gastroenterology

## 2011-08-02 ENCOUNTER — Ambulatory Visit (INDEPENDENT_AMBULATORY_CARE_PROVIDER_SITE_OTHER): Payer: Managed Care, Other (non HMO) | Admitting: *Deleted

## 2011-08-02 DIAGNOSIS — Z7901 Long term (current) use of anticoagulants: Secondary | ICD-10-CM

## 2011-08-02 DIAGNOSIS — Z8679 Personal history of other diseases of the circulatory system: Secondary | ICD-10-CM

## 2011-08-02 DIAGNOSIS — I4891 Unspecified atrial fibrillation: Secondary | ICD-10-CM

## 2011-08-02 LAB — POCT INR: INR: 1.9

## 2011-08-15 ENCOUNTER — Encounter: Payer: Self-pay | Admitting: Gynecologic Oncology

## 2011-08-15 ENCOUNTER — Ambulatory Visit: Payer: Managed Care, Other (non HMO) | Attending: Gynecologic Oncology | Admitting: Gynecologic Oncology

## 2011-08-15 VITALS — BP 128/70 | HR 72 | Temp 98.7°F | Resp 16 | Ht 62.44 in | Wt 202.0 lb

## 2011-08-15 DIAGNOSIS — F17200 Nicotine dependence, unspecified, uncomplicated: Secondary | ICD-10-CM | POA: Insufficient documentation

## 2011-08-15 DIAGNOSIS — Z85828 Personal history of other malignant neoplasm of skin: Secondary | ICD-10-CM | POA: Insufficient documentation

## 2011-08-15 DIAGNOSIS — Z7901 Long term (current) use of anticoagulants: Secondary | ICD-10-CM | POA: Insufficient documentation

## 2011-08-15 DIAGNOSIS — Z800 Family history of malignant neoplasm of digestive organs: Secondary | ICD-10-CM | POA: Insufficient documentation

## 2011-08-15 DIAGNOSIS — I4891 Unspecified atrial fibrillation: Secondary | ICD-10-CM | POA: Insufficient documentation

## 2011-08-15 DIAGNOSIS — F411 Generalized anxiety disorder: Secondary | ICD-10-CM | POA: Insufficient documentation

## 2011-08-15 DIAGNOSIS — I10 Essential (primary) hypertension: Secondary | ICD-10-CM | POA: Insufficient documentation

## 2011-08-15 DIAGNOSIS — G4733 Obstructive sleep apnea (adult) (pediatric): Secondary | ICD-10-CM | POA: Insufficient documentation

## 2011-08-15 DIAGNOSIS — Z8673 Personal history of transient ischemic attack (TIA), and cerebral infarction without residual deficits: Secondary | ICD-10-CM | POA: Insufficient documentation

## 2011-08-15 DIAGNOSIS — D071 Carcinoma in situ of vulva: Secondary | ICD-10-CM | POA: Insufficient documentation

## 2011-08-15 DIAGNOSIS — R3129 Other microscopic hematuria: Secondary | ICD-10-CM | POA: Insufficient documentation

## 2011-08-15 DIAGNOSIS — E785 Hyperlipidemia, unspecified: Secondary | ICD-10-CM | POA: Insufficient documentation

## 2011-08-15 NOTE — Patient Instructions (Signed)
Follow up with Dr. Tresa Res in 6 months.

## 2011-08-15 NOTE — Progress Notes (Signed)
Consult Note: Gyn-Onc  Linda Ruiz 61 y.o. female  CC:  Chief Complaint  Patient presents with  . VIN III    Follow up    HPI: Patient is a 61 year old gravida 1 para 1 who was seen by Dr. Tresa Res on April 3. This was the time of her annual examination. At that time a lesion was noted on the right inferior aspect of the vulva. A small excision was done in the office of the lesion. It revealed VIN-III. She comes in today for evaluation of this. The patient states she has a history of vulvar dysplasia back in 2008 at which time she had a wide local excision. I do not know what the margin status was. The patient has never been symptomatic from this. She states that Dr. Tresa Res as noticed the lesion at each of the time of her annual exams. She denies any bleeding pruritus or pain.   Interval History:  When she saw her primary physician Dr. Lelon Frohlich she was noted to have microscopic hematuria. She has recently seen a urologist. CT scan was negative and per the patient, her cystoscopy was negative today as well. The urine culture was negative. She denies any other complaints. Does have occasional stress urinary incontinence with coughing. She denies any change in her bowel or bladder habits otherwise.  I performed a small wide local excision of the vulva in the office on May 15. Findings at that time included a lesion in the inferior aspect of the right labia majora. The results revealed a seborrheic keratosis with features of condyloma acuminata. The comment from pathology revealed that the seborrheic keratoses on or near genitalia are frequently associated with HPV and in that instance are essentially condyloma acuminatum. There is no evidence of VIN or high-grade dysplasia or malignancy.  She comes in today for brief post procedural checkup.   Review of Systems: She did very well after the procedure. She had no bleeding or pain. She feels that the sutures have dissolved. Her review of systems  otherwise negative for any other issues.  Current Meds:  Outpatient Encounter Prescriptions as of 08/15/2011  Medication Sig Dispense Refill  . atorvastatin (LIPITOR) 20 MG tablet Take 20 mg by mouth daily.        . metoprolol (TOPROL-XL) 50 MG 24 hr tablet take 1 tablet by mouth once daily  30 tablet  8  . peg 3350 powder (MOVIPREP) 100 G SOLR Take 1 kit (100 g total) by mouth once.  1 kit  0  . quinapril (ACCUPRIL) 20 MG tablet Take 20 mg by mouth at bedtime.        . sertraline (ZOLOFT) 50 MG tablet Take 50 mg by mouth daily.        . verapamil (CALAN-SR) 240 MG CR tablet Take 1 tablet (240 mg total) by mouth at bedtime.  30 tablet  9  . warfarin (COUMADIN) 5 MG tablet as directed PER ANTICOAGULATION CLINIC  45 tablet  3   Facility-Administered Encounter Medications as of 08/15/2011  Medication Dose Route Frequency Provider Last Rate Last Dose  . 0.9 %  sodium chloride infusion  500 mL Intravenous Continuous Mardella Layman, MD        Allergy:  Allergies  Allergen Reactions  . Sulfonamide Derivatives Rash    Social Hx:   History   Social History  . Marital Status: Married    Spouse Name: N/A    Number of Children: N/A  . Years of Education:  N/A   Occupational History  . customer service    Social History Main Topics  . Smoking status: Current Everyday Smoker -- 0.5 packs/day for 8 years    Types: Cigarettes  . Smokeless tobacco: Never Used  . Alcohol Use: Yes     Rare  . Drug Use: No  . Sexually Active: Not Currently   Other Topics Concern  . Not on file   Social History Narrative  . No narrative on file    Past Surgical Hx:  Past Surgical History  Procedure Date  . Cesarean section   . Tubaligation   . Polypectomy     endometrial polyp removed 2005 D&C  . Foot surgery 05/2010    Left  . Skin cancer removal     this past year and five years ago    Past Medical Hx:  Past Medical History  Diagnosis Date  . Atrial fibrillation   . Hypertension   .  Hyperlipidemia   . Anxiety   . CVA (cerebral infarction)   . Skin cancer   . Sleep apnea     Family Hx:  Family History  Problem Relation Age of Onset  . Cancer      mother and father  . Colon cancer Neg Hx   . Esophageal cancer Neg Hx   . Rectal cancer Neg Hx   . Stomach cancer Neg Hx     Vitals:  Blood pressure 128/70, pulse 72, temperature 98.7 F (37.1 C), temperature source Oral, resp. rate 16, height 5' 2.44" (1.586 m), weight 202 lb (91.627 kg).  Physical Exam: Well-nourished well-developed female in no acute distress. On external genitalia reveals the excision site on the right to be well-healed. There are no additional lesions.  Assessment/Plan: 61 year old with a history of vulvar dysplasia in 2008. She was seen by Dr. Tresa Res for her annual examination in April of this year and had a lesion that was biopsy positive VIN-III. Re-excision at that site showed a seborrheic keratoses with no additional dysplasia. Plan she'll return to see Dr. Tresa Res in 6 months for a vulvar check to assure there is no new lesions. The patient to keep congnizant of any new vulvar lesions. Return to see [place] in clinic on a when necessary basis.  Lothar Prehn A., MD 08/15/2011, 8:52 AM

## 2011-08-23 ENCOUNTER — Ambulatory Visit (INDEPENDENT_AMBULATORY_CARE_PROVIDER_SITE_OTHER): Payer: Managed Care, Other (non HMO) | Admitting: *Deleted

## 2011-08-23 DIAGNOSIS — I4891 Unspecified atrial fibrillation: Secondary | ICD-10-CM

## 2011-08-23 DIAGNOSIS — Z8679 Personal history of other diseases of the circulatory system: Secondary | ICD-10-CM

## 2011-08-23 DIAGNOSIS — Z7901 Long term (current) use of anticoagulants: Secondary | ICD-10-CM

## 2011-08-23 LAB — POCT INR: INR: 2.9

## 2011-09-20 ENCOUNTER — Ambulatory Visit (INDEPENDENT_AMBULATORY_CARE_PROVIDER_SITE_OTHER): Payer: Managed Care, Other (non HMO) | Admitting: *Deleted

## 2011-09-20 DIAGNOSIS — Z8679 Personal history of other diseases of the circulatory system: Secondary | ICD-10-CM

## 2011-09-20 DIAGNOSIS — Z7901 Long term (current) use of anticoagulants: Secondary | ICD-10-CM

## 2011-09-20 DIAGNOSIS — I4891 Unspecified atrial fibrillation: Secondary | ICD-10-CM

## 2011-10-18 ENCOUNTER — Ambulatory Visit (INDEPENDENT_AMBULATORY_CARE_PROVIDER_SITE_OTHER): Payer: Managed Care, Other (non HMO) | Admitting: *Deleted

## 2011-10-18 DIAGNOSIS — Z8679 Personal history of other diseases of the circulatory system: Secondary | ICD-10-CM

## 2011-10-18 DIAGNOSIS — I4891 Unspecified atrial fibrillation: Secondary | ICD-10-CM

## 2011-10-18 DIAGNOSIS — Z7901 Long term (current) use of anticoagulants: Secondary | ICD-10-CM

## 2011-10-24 ENCOUNTER — Emergency Department (HOSPITAL_COMMUNITY)
Admission: EM | Admit: 2011-10-24 | Discharge: 2011-10-25 | Disposition: A | Discharge status: Home / Self Care | Payer: Managed Care, Other (non HMO) | Attending: Emergency Medicine | Admitting: Emergency Medicine

## 2011-10-24 ENCOUNTER — Encounter (HOSPITAL_COMMUNITY): Payer: Self-pay | Admitting: *Deleted

## 2011-10-24 ENCOUNTER — Emergency Department (HOSPITAL_COMMUNITY): Payer: Managed Care, Other (non HMO)

## 2011-10-24 DIAGNOSIS — G4730 Sleep apnea, unspecified: Secondary | ICD-10-CM | POA: Insufficient documentation

## 2011-10-24 DIAGNOSIS — F411 Generalized anxiety disorder: Secondary | ICD-10-CM | POA: Insufficient documentation

## 2011-10-24 DIAGNOSIS — R1031 Right lower quadrant pain: Secondary | ICD-10-CM | POA: Insufficient documentation

## 2011-10-24 DIAGNOSIS — Z7901 Long term (current) use of anticoagulants: Secondary | ICD-10-CM | POA: Insufficient documentation

## 2011-10-24 DIAGNOSIS — I4891 Unspecified atrial fibrillation: Secondary | ICD-10-CM | POA: Insufficient documentation

## 2011-10-24 DIAGNOSIS — X58XXXA Exposure to other specified factors, initial encounter: Secondary | ICD-10-CM | POA: Insufficient documentation

## 2011-10-24 DIAGNOSIS — R42 Dizziness and giddiness: Secondary | ICD-10-CM | POA: Insufficient documentation

## 2011-10-24 DIAGNOSIS — Z8589 Personal history of malignant neoplasm of other organs and systems: Secondary | ICD-10-CM | POA: Insufficient documentation

## 2011-10-24 DIAGNOSIS — F17200 Nicotine dependence, unspecified, uncomplicated: Secondary | ICD-10-CM | POA: Insufficient documentation

## 2011-10-24 DIAGNOSIS — Z79899 Other long term (current) drug therapy: Secondary | ICD-10-CM | POA: Insufficient documentation

## 2011-10-24 DIAGNOSIS — I10 Essential (primary) hypertension: Secondary | ICD-10-CM | POA: Insufficient documentation

## 2011-10-24 DIAGNOSIS — E785 Hyperlipidemia, unspecified: Secondary | ICD-10-CM | POA: Insufficient documentation

## 2011-10-24 DIAGNOSIS — S301XXA Contusion of abdominal wall, initial encounter: Secondary | ICD-10-CM | POA: Insufficient documentation

## 2011-10-24 LAB — CBC WITH DIFFERENTIAL/PLATELET
Basophils Absolute: 0 10*3/uL (ref 0.0–0.1)
Basophils Relative: 0 % (ref 0–1)
Eosinophils Relative: 2 % (ref 0–5)
HCT: 44.7 % (ref 36.0–46.0)
MCH: 32 pg (ref 26.0–34.0)
MCHC: 33.3 g/dL (ref 30.0–36.0)
MCV: 95.9 fL (ref 78.0–100.0)
Monocytes Absolute: 0.5 10*3/uL (ref 0.1–1.0)
RDW: 12.7 % (ref 11.5–15.5)

## 2011-10-24 LAB — BASIC METABOLIC PANEL
CO2: 30 mEq/L (ref 19–32)
Calcium: 9.8 mg/dL (ref 8.4–10.5)
Creatinine, Ser: 0.85 mg/dL (ref 0.50–1.10)
GFR calc Af Amer: 85 mL/min — ABNORMAL LOW (ref >90)

## 2011-10-24 MED ORDER — HYDROMORPHONE HCL PF 1 MG/ML IJ SOLN
1.0000 mg | Freq: Once | INTRAMUSCULAR | Status: AC
  Administered 2011-10-24: 1 mg via INTRAVENOUS
  Filled 2011-10-24: qty 1

## 2011-10-24 MED ORDER — HYDROMORPHONE HCL PF 1 MG/ML IJ SOLN: 1.0000 mg | Freq: Once | INTRAMUSCULAR | Status: AC

## 2011-10-24 MED ORDER — IOHEXOL 300 MG/ML  SOLN
20.0000 mL | INTRAMUSCULAR | Status: AC
  Administered 2011-10-24: 20 mL via ORAL

## 2011-10-24 MED ORDER — ONDANSETRON HCL 4 MG/2ML IJ SOLN
4.0000 mg | Freq: Once | INTRAMUSCULAR | Status: AC
  Administered 2011-10-24: 4 mg via INTRAVENOUS
  Filled 2011-10-24: qty 2

## 2011-10-24 MED ORDER — HYDROMORPHONE HCL PF 1 MG/ML IJ SOLN
1.0000 mg | INTRAMUSCULAR | Status: AC
  Administered 2011-10-24: 1 mg via INTRAVENOUS
  Filled 2011-10-24: qty 1

## 2011-10-24 MED ORDER — LACTATED RINGERS IV BOLUS (SEPSIS)
500.0000 mL | Freq: Once | INTRAVENOUS | Status: AC
  Administered 2011-10-24: 500 mL via INTRAVENOUS

## 2011-10-24 NOTE — ED Notes (Signed)
Pt c/o RLQ pain since Monday while cleaning tub, at 5:45 tonight, pain became severe.  Feels nauseous, no vomiting.

## 2011-10-24 NOTE — ED Notes (Signed)
Pt very tearful, stating that pain is increasing.  Still unable to give urine d/t pain.  Hr increasing.  Will re-assess in 30 min if pt not back.

## 2011-10-24 NOTE — ED Provider Notes (Signed)
History     CSN: [id number]  Arrival date & time 10/24/11  1939   First MD Initiated Contact with Patient 10/24/11 2210      Chief Complaint  Patient presents with  . Abdominal Pain    (Consider location/radiation/quality/duration/timing/severity/associated sxs/prior treatment) HPI  This is a 61 year old woman with a past medical history of atrial fibrillation and on Coumadin, hypertension, hyperlipidemia, and anxiety who presents with severe right lower quadrant pain for the last 4 hours. She reports that she's had soreness around the same point for the last one week, but today it suddenly worsened to a severity of 10/10. She describes the pain as sharp and constant, radiating to her back. It is increased on change of position, coughing, and is relieved by lying still. She reports being dizzy. She denies history of vomiting, however, she reports feeling nauseated. She had a bowel movement yesterday, which was good, without any blood. She denies any urinary symptoms.  She has had a long history of hematuria, which has been evaluated by a reduced. There has been no cause identified cause of this hematuria that identified by cystoscopy. She has also been following up with a gynecologist and an oncologist for history of early cervical malignancy. 4 months ago. She had a colonoscopy which revealed 5 polyps without malignancy, and she was told that she also has diverticulitis. She denies any GI bleeding. She admits to a history of heavy cigarette smoking.    Past Medical History  Diagnosis Date  . Atrial fibrillation   . Hypertension   . Hyperlipidemia   . Anxiety   . CVA (cerebral infarction)   . Skin cancer   . Sleep apnea     Past Surgical History  Procedure Date  . Cesarean section   . Tubaligation   . Polypectomy     endometrial polyp removed 2005 D&C  . Foot surgery 05/2010    Left  . Skin cancer removal     this past year and five years ago    Family History  Problem  Relation Age of Onset  . Cancer      mother and father  . Colon cancer Neg Hx   . Esophageal cancer Neg Hx   . Rectal cancer Neg Hx   . Stomach cancer Neg Hx     History  Substance Use Topics  . Smoking status: Current Everyday Smoker -- 0.5 packs/day for 8 years    Types: Cigarettes  . Smokeless tobacco: Never Used  . Alcohol Use: Yes     Rare    OB History    Grav Para Term Preterm Abortions TAB SAB Ect Mult Living                  Review of Systems  Constitutional: Negative for fever, chills, diaphoresis, fatigue and unexpected weight change.  HENT: Negative.   Eyes: Negative.   Respiratory: Positive for cough. Negative for chest tightness, shortness of breath, wheezing and stridor.   Cardiovascular: Positive for chest pain. Negative for palpitations and leg swelling.  Gastrointestinal: Positive for abdominal distention. Negative for diarrhea, constipation, blood in stool and rectal pain.  Genitourinary: Positive for hematuria. Negative for dysuria, urgency, frequency, decreased urine volume, vaginal bleeding, difficulty urinating and menstrual problem.  Musculoskeletal: Negative.   Skin: Negative.   Neurological: Negative.   Hematological: Negative.   Psychiatric/Behavioral: Negative.     Allergies  Sulfonamide derivatives  Home Medications   Current Outpatient Rx  Name Route Sig Dispense  Refill  . ATORVASTATIN CALCIUM 20 MG PO TABS Oral Take 20 mg by mouth daily.      . GUAIFENESIN ER 600 MG PO TB12 Oral Take 1,200 mg by mouth 2 (two) times daily as needed. For drainage    . METOPROLOL SUCCINATE ER 50 MG PO TB24 Oral Take 50 mg by mouth every evening. Take with or immediately following a meal.    . QUINAPRIL HCL 20 MG PO TABS Oral Take 20 mg by mouth at bedtime.      . SERTRALINE HCL 50 MG PO TABS Oral Take 50 mg by mouth at bedtime.     Marland Kitchen VERAPAMIL HCL ER 240 MG PO TBCR Oral Take 1 tablet (240 mg total) by mouth at bedtime. 30 tablet 9  . WARFARIN SODIUM 5 MG  PO TABS Oral Take 5-7.5 mg by mouth See admin instructions. Take 1 1/2 tablets (7.5 mg) every Monday and 1 tablet (5 mg) on all other days      BP 118/58  Pulse 78  Temp 98.6 F (37 C) (Oral)  Resp 18  SpO2 96%  Physical Exam  Constitutional: She is oriented to person, place, and time. She appears well-developed and well-nourished. She appears distressed.  HENT:  Head: Normocephalic and atraumatic.  Eyes: Conjunctivae are normal. Pupils are equal, round, and reactive to light. Right eye exhibits no discharge. Left eye exhibits no discharge.  Neck: Normal range of motion. Neck supple.  Cardiovascular: Normal rate, regular rhythm, normal heart sounds and intact distal pulses.   Pulmonary/Chest: Effort normal and breath sounds normal. No respiratory distress. She has no wheezes. She has no rales. She exhibits no tenderness.  Abdominal: Soft. Normal appearance and bowel sounds are normal. She exhibits no distension. There is no hepatosplenomegaly or splenomegaly. There is tenderness in the right upper quadrant, right lower quadrant and periumbilical area. There is guarding. There is no rebound and negative Murphy's sign. Hernia confirmed negative in the ventral area, confirmed negative in the right inguinal area and confirmed negative in the left inguinal area.  Musculoskeletal: Normal range of motion. She exhibits no edema and no tenderness.  Neurological: She is oriented to person, place, and time. She has normal reflexes.  Skin: Skin is warm and dry.  Psychiatric: She has a normal mood and affect.    ED Course  Procedures (including critical care time)  Labs Reviewed  URINALYSIS, ROUTINE W REFLEX MICROSCOPIC - Abnormal; Notable for the following:    APPearance CLOUDY (*)     Hgb urine dipstick LARGE (*)     Leukocytes, UA MODERATE (*)     All other components within normal limits  BASIC METABOLIC PANEL - Abnormal; Notable for the following:    Glucose, Bld 120 (*)     GFR calc non  Af Amer 74 (*)     GFR calc Af Amer 85 (*)     All other components within normal limits  URINE MICROSCOPIC-ADD ON - Abnormal; Notable for the following:    Squamous Epithelial / LPF MANY (*)     Bacteria, UA FEW (*)     All other components within normal limits  CBC WITH DIFFERENTIAL  PROTIME-INR  APTT  TYPE AND SCREEN  PROTIME-INR   Ct Abdomen Pelvis W Contrast  10/25/2011  *RADIOLOGY REPORT*  Clinical Data: Right lower quadrant abdominal pain.  CT ABDOMEN AND PELVIS WITH CONTRAST  Technique:  Multidetector CT imaging of the abdomen and pelvis was performed following the standard protocol during  bolus administration of intravenous contrast.  Contrast: OMNIPAQUE IOHEXOL 300 MG/ML  SOLN  Comparison: 07/03/2011  Findings: Aortic valve calcification.  Mild cardiomegaly.  No pleural or pericardial effusion.  Lung bases are predominately clear with mild dependent atelectasis and/or scarring.  Low attenuation of the liver is nonspecific post contrast however suggests fatty infiltration.  Unremarkable biliary system, spleen, pancreas, adrenal glands.  Symmetric renal enhancement.  No hydronephrosis or hydroureter.  No bowel obstruction.  Colonic diverticulosis.  No CT evidence for colitis or diverticulitis.  Normal appendix.  No free intraperitoneal air or fluid.  No lymphadenopathy.  There is scattered atherosclerotic calcification of the aorta and its branches. No aneurysmal dilatation.  Decompressed bladder.  Unremarkable CT appearance to the uterus and adnexa.  Multilevel degenerative changes of the imaged spine. No acute or aggressive appearing osseous lesion.  There is a 3.6 x 9.4 cm hematoma centered within the right rectus abdominus musculature. Blush of hyper vascularity is in keeping with active bleed.  IMPRESSION: Right rectus abdominus muscle hematoma with evidence of active bleed.  Hepatic steatosis.  No acute intra-abdominal process.  Discussed via telephone with Dr. Rulon Abide at 01:10 a.m. on  10/25/2011.   Original Report Authenticated By: Waneta Martins, M.D.    Dg Chest Portable 1 View  10/24/2011  *RADIOLOGY REPORT*  Clinical Data: Shortness of breath.  PORTABLE CHEST - 1 VIEW  Comparison: Chest 01/11/2008.  Findings: Lungs clear.  Heart size upper normal.  No pneumothorax or pleural fluid.  IMPRESSION: No acute disease.   Original Report Authenticated By: Bernadene Bell. D'ALESSIO, M.D.         MDM  In this 61 year old woman, who presents with a one-week history of right lower quadrant pain for the last one week, that has increased significantly today, I am concerned about diverticulitis, appendicitis, or tuboovarian complication. However, give her long history of hematuria with a negative cystoscopy, there is a possibility kidney tumour. She had a normal abdominal ultrasound and a CT scan which showed a right ovarian cyst10 years ago. We will proceed and do an abdominal CT scan.   1:31 AM Note results of the CT scan showing hematoma with active bleeding in the right rectus muscle.  Patient will have an INR test and cross and cross matching.        Dow Adolph, MD 10/25/11 [phone]

## 2011-10-25 ENCOUNTER — Encounter (HOSPITAL_COMMUNITY): Payer: Self-pay | Admitting: Radiology

## 2011-10-25 ENCOUNTER — Emergency Department (HOSPITAL_COMMUNITY): Payer: Managed Care, Other (non HMO)

## 2011-10-25 LAB — URINALYSIS, ROUTINE W REFLEX MICROSCOPIC
Glucose, UA: NEGATIVE mg/dL
Ketones, ur: NEGATIVE mg/dL
Protein, ur: NEGATIVE mg/dL

## 2011-10-25 LAB — TYPE AND SCREEN

## 2011-10-25 LAB — PROTIME-INR
INR: 2.66 — ABNORMAL HIGH (ref 0.00–1.49)
Prothrombin Time: 28.8 seconds — ABNORMAL HIGH (ref 11.6–15.2)

## 2011-10-25 LAB — URINE MICROSCOPIC-ADD ON

## 2011-10-25 LAB — ABO/RH: ABO/RH(D): O POS

## 2011-10-25 MED ORDER — IOHEXOL 300 MG/ML  SOLN
100.0000 mL | Freq: Once | INTRAMUSCULAR | Status: AC | PRN
  Administered 2011-10-25: 100 mL via INTRAVENOUS

## 2011-10-25 MED ORDER — HYDROCODONE-ACETAMINOPHEN 5-500 MG PO TABS: 1.0000 | ORAL_TABLET | Freq: Four times a day (QID) | ORAL | Status: AC | PRN

## 2011-10-25 MED ORDER — HYDROMORPHONE HCL PF 1 MG/ML IJ SOLN
1.0000 mg | Freq: Once | INTRAMUSCULAR | Status: AC
  Administered 2011-10-25: 1 mg via INTRAVENOUS
  Filled 2011-10-25: qty 1

## 2011-10-25 NOTE — ED Provider Notes (Signed)
3:27 AM d/w GMA on call - MD will notify Jacky Kindle in the am for close follow up.  Agreed upon plan no Vit K or reversal at this time. Jacky Kindle to follow up.   Pian improved and PT states understanding d/c and f/u instructions.   I saw and evaluated the patient, reviewed the resident's note and I agree with the findings and plan.  Results for orders placed during the hospital encounter of 10/24/11  URINALYSIS, ROUTINE W REFLEX MICROSCOPIC      Component Value Range   Color, Urine YELLOW  YELLOW   APPearance CLOUDY (*) CLEAR   Specific Gravity, Urine 1.016  1.005 - 1.030   pH 6.0  5.0 - 8.0   Glucose, UA NEGATIVE  NEGATIVE mg/dL   Hgb urine dipstick LARGE (*) NEGATIVE   Bilirubin Urine NEGATIVE  NEGATIVE   Ketones, ur NEGATIVE  NEGATIVE mg/dL   Protein, ur NEGATIVE  NEGATIVE mg/dL   Urobilinogen, UA 0.2  0.0 - 1.0 mg/dL   Nitrite NEGATIVE  NEGATIVE   Leukocytes, UA MODERATE (*) NEGATIVE  CBC WITH DIFFERENTIAL      Component Value Range   WBC 7.9  4.0 - 10.5 K/uL   RBC 4.66  3.87 - 5.11 MIL/uL   Hemoglobin 14.9  12.0 - 15.0 g/dL   HCT 16.1  09.6 - 04.5 %   MCV 95.9  78.0 - 100.0 fL   MCH 32.0  26.0 - 34.0 pg   MCHC 33.3  30.0 - 36.0 g/dL   RDW 40.9  81.1 - 91.4 %   Platelets 215  150 - 400 K/uL   Neutrophils Relative 59  43 - 77 %   Neutro Abs 4.6  1.7 - 7.7 K/uL   Lymphocytes Relative 34  12 - 46 %   Lymphs Abs 2.7  0.7 - 4.0 K/uL   Monocytes Relative 6  3 - 12 %   Monocytes Absolute 0.5  0.1 - 1.0 K/uL   Eosinophils Relative 2  0 - 5 %   Eosinophils Absolute 0.1  0.0 - 0.7 K/uL   Basophils Relative 0  0 - 1 %   Basophils Absolute 0.0  0.0 - 0.1 K/uL  BASIC METABOLIC PANEL      Component Value Range   Sodium 140  135 - 145 mEq/L   Potassium 4.3  3.5 - 5.1 mEq/L   Chloride 100  96 - 112 mEq/L   CO2 30  19 - 32 mEq/L   Glucose, Bld 120 (*) 70 - 99 mg/dL   BUN 12  6 - 23 mg/dL   Creatinine, Ser 7.82  0.50 - 1.10 mg/dL   Calcium 9.8  8.4 - 95.6 mg/dL   GFR calc non Af Amer  74 (*) >90 mL/min   GFR calc Af Amer 85 (*) >90 mL/min  PROTIME-INR      Component Value Range   Prothrombin Time 28.8 (*) 11.6 - 15.2 seconds   INR 2.66 (*) 0.00 - 1.49  URINE MICROSCOPIC-ADD ON      Component Value Range   Squamous Epithelial / LPF MANY (*) RARE   WBC, UA 11-20  <3 WBC/hpf   RBC / HPF 11-20  <3 RBC/hpf   Bacteria, UA FEW (*) RARE   Urine-Other MUCOUS PRESENT    APTT      Component Value Range   aPTT 43 (*) 24 - 37 seconds  TYPE AND SCREEN      Component Value Range   ABO/RH(D) [place]  POS     Antibody Screen NEG     Sample Expiration 10/28/2011    ABO/RH      Component Value Range   ABO/RH(D) O POS     Ct Abdomen Pelvis W Contrast  10/25/2011  *RADIOLOGY REPORT*  Clinical Data: Right lower quadrant abdominal pain.  CT ABDOMEN AND PELVIS WITH CONTRAST  Technique:  Multidetector CT imaging of the abdomen and pelvis was performed following the standard protocol during bolus administration of intravenous contrast.  Contrast: OMNIPAQUE IOHEXOL 300 MG/ML  SOLN  Comparison: 07/03/2011  Findings: Aortic valve calcification.  Mild cardiomegaly.  No pleural or pericardial effusion.  Lung bases are predominately clear with mild dependent atelectasis and/or scarring.  Low attenuation of the liver is nonspecific post contrast however suggests fatty infiltration.  Unremarkable biliary system, spleen, pancreas, adrenal glands.  Symmetric renal enhancement.  No hydronephrosis or hydroureter.  No bowel obstruction.  Colonic diverticulosis.  No CT evidence for colitis or diverticulitis.  Normal appendix.  No free intraperitoneal air or fluid.  No lymphadenopathy.  There is scattered atherosclerotic calcification of the aorta and its branches. No aneurysmal dilatation.  Decompressed bladder.  Unremarkable CT appearance to the uterus and adnexa.  Multilevel degenerative changes of the imaged spine. No acute or aggressive appearing osseous lesion.  There is a 3.6 x 9.4 cm hematoma centered  within the right rectus abdominus musculature. Blush of hyper vascularity is in keeping with active bleed.  IMPRESSION: Right rectus abdominus muscle hematoma with evidence of active bleed.  Hepatic steatosis.  No acute intra-abdominal process.  Discussed via telephone with Dr. Rulon Abide at 01:10 a.m. on 10/25/2011.   Original Report Authenticated By: Waneta Martins, M.D.    Dg Chest Portable 1 View  10/24/2011  *RADIOLOGY REPORT*  Clinical Data: Shortness of breath.  PORTABLE CHEST - 1 VIEW  Comparison: Chest 01/11/2008.  Findings: Lungs clear.  Heart size upper normal.  No pneumothorax or pleural fluid.  IMPRESSION: No acute disease.   Original Report Authenticated By: Bernadene Bell. Maricela Curet, M.D.       Sunnie Nielsen, MD 10/25/11 [phone]

## 2011-10-30 ENCOUNTER — Other Ambulatory Visit: Payer: Self-pay | Admitting: Cardiovascular Disease

## 2011-11-01 ENCOUNTER — Telehealth: Payer: Self-pay | Admitting: Cardiovascular Disease

## 2011-11-01 DIAGNOSIS — K922 Gastrointestinal hemorrhage, unspecified: Secondary | ICD-10-CM

## 2011-11-01 NOTE — Telephone Encounter (Signed)
plz return call to patient [phone], pt was seen at ER over the weekend and has questions about medical care.

## 2011-11-01 NOTE — Telephone Encounter (Signed)
SPOKE WITH PT PER PT WAS SEEN IN ER   FOR SEVERE ABD PAIN  CT WAS DONE  SHOWS ACTIVE BLEED WAS TOLD TO HOLD COUMADIN AND  RESTART   AFTER 5 DAYS  PER DR NISHAN   DO NOT START COUMADIN UNTIL SEEN APPT MADE FOR 11-05-11   WILL REPEAT  ABD CT DONE  AFTER  DR NISHAN'S APPT. PT AWARE .Zack Seal

## 2011-11-04 ENCOUNTER — Ambulatory Visit (INDEPENDENT_AMBULATORY_CARE_PROVIDER_SITE_OTHER)
Admission: RE | Admit: 2011-11-04 | Discharge: 2011-11-04 | Disposition: A | Discharge status: Home / Self Care | Payer: Managed Care, Other (non HMO) | Source: Ambulatory Visit | Attending: Cardiovascular Disease | Admitting: Cardiovascular Disease

## 2011-11-04 DIAGNOSIS — K922 Gastrointestinal hemorrhage, unspecified: Secondary | ICD-10-CM

## 2011-11-04 MED ORDER — IOHEXOL 300 MG/ML  SOLN
100.0000 mL | Freq: Once | INTRAMUSCULAR | Status: AC | PRN
  Administered 2011-11-04: 100 mL via INTRAVENOUS

## 2011-11-05 ENCOUNTER — Ambulatory Visit (INDEPENDENT_AMBULATORY_CARE_PROVIDER_SITE_OTHER): Payer: Managed Care, Other (non HMO) | Admitting: Cardiovascular Disease

## 2011-11-05 ENCOUNTER — Encounter: Payer: Self-pay | Admitting: Cardiovascular Disease

## 2011-11-05 VITALS — BP 96/60 | HR 64 | Ht 62.0 in | Wt 197.8 lb

## 2011-11-05 DIAGNOSIS — E782 Mixed hyperlipidemia: Secondary | ICD-10-CM

## 2011-11-05 DIAGNOSIS — I10 Essential (primary) hypertension: Secondary | ICD-10-CM

## 2011-11-05 DIAGNOSIS — Z7901 Long term (current) use of anticoagulants: Secondary | ICD-10-CM

## 2011-11-05 DIAGNOSIS — I4891 Unspecified atrial fibrillation: Secondary | ICD-10-CM

## 2011-11-05 NOTE — Patient Instructions (Signed)
Your physician recommends that you schedule a follow-up appointment in: 3 weeks with dr Eden Emms Your physician has recommended you make the following change in your medication: CONTINUE TO HOLD COUMADIN

## 2011-11-05 NOTE — Assessment & Plan Note (Signed)
Good rate control  Anticoagulaton on hold for right rectus sheath hematoma  Has been off coumadin before with no lovenox bridge

## 2011-11-05 NOTE — Assessment & Plan Note (Signed)
Cholesterol is at goal.  Continue current dose of statin and diet Rx.  No myalgias or side effects.  F/U  LFT's in 6 months. Lab Results  Component Value Date   LDLCALC 125* 04/06/2008             

## 2011-11-05 NOTE — Assessment & Plan Note (Signed)
Well controlled.  Continue current medications and low sodium Dash type diet.    

## 2011-11-05 NOTE — Progress Notes (Signed)
Patient ID: Linda Ruiz, female   DOB: [birth year], 61 y.o.   MRN: [id number] Manuel is seen today for F/U of her chronic afib, hypertension. She is asymptomatic with no palpitations, PNC, orthopnea, diaphoresis or syncope. She continues to work in [employer]	[place].  She had a spontaneous right rectus muscle hematoma on 8/29.  Hct stable at 44.  F/U CT 9/6 stable but still large hematoma.  She was bending over a tub  Cleaning it and thinks she pressed too hard on the tub for a long period of time.  Still painful to cough and change position.  Off coumadin since 8/29.  INR was not excessive at time.  Has been off coumadin in past with no ill effects for foot surgery and no lovenox overlap.  ROS: Denies fever, malais, weight loss, blurry vision, decreased visual acuity, cough, sputum, SOB, hemoptysis, pleuritic pain, palpitaitons, heartburn, abdominal pain, melena, lower extremity edema, claudication, or rash.  All other systems reviewed and negative  General: Affect appropriate Healthy:  appears stated age HEENT: normal Neck supple with no adenopathy JVP normal no bruits no thyromegaly Lungs clear with no wheezing and good diaphragmatic motion Heart:  S1/S2 no murmur, no rub, gallop or click PMI normal Abdomen: benighn, BS positve, no tenderness, no AAA no bruit.  No HSM or HJR Distal pulses intact with no bruits No edema Neuro non-focal Skin warm and dry No muscular weakness   Current Outpatient Prescriptions  Medication Sig Dispense Refill  . guaiFENesin (MUCINEX) 600 MG 12 hr tablet Take 1,200 mg by mouth 2 (two) times daily as needed. For drainage      . metoprolol succinate (TOPROL-XL) 50 MG 24 hr tablet Take 50 mg by mouth every evening. Take with or immediately following a meal.      . quinapril (ACCUPRIL) 20 MG tablet Take 20 mg by mouth at bedtime.        . sertraline (ZOLOFT) 50 MG tablet Take 50 mg by mouth at bedtime.       . verapamil (CALAN-SR) 240 MG CR tablet Take  1 tablet (240 mg total) by mouth at bedtime.  30 tablet  9  . HYDROcodone-acetaminophen (VICODIN) 5-500 MG per tablet Take 1-2 tablets by mouth every 6 (six) hours as needed for pain.  15 tablet  0  . warfarin (COUMADIN) 5 MG tablet Take 5-7.5 mg by mouth See admin instructions. Take 1 1/2 tablets (7.5 mg) every Monday and 1 tablet (5 mg) on all other days      . warfarin (COUMADIN) 5 MG tablet as directed PER ANTICOAGULATION CLINIC  45 tablet  3   Current Facility-Administered Medications  Medication Dose Route Frequency Provider Last Rate Last Dose  . 0.9 %  sodium chloride infusion  500 mL Intravenous Continuous Mardella Layman, MD       Facility-Administered Medications Ordered in Other Visits  Medication Dose Route Frequency Provider Last Rate Last Dose  . iohexol (OMNIPAQUE) 300 MG/ML solution 100 mL  100 mL Intravenous Once PRN Medication Radiologist, MD   100 mL at 11/04/11 1542    Allergies  Sulfonamide derivatives  Electrocardiogram:  05/25/10  Afib rate 99 nonspecific ST/T wave changes poor R wave progression  Assessment and Plan

## 2011-11-05 NOTE — Assessment & Plan Note (Signed)
Reviewed CT from 8/29 and 9/6  Hematoma is stable but large.  Still painful and palpable on exam.  Hold coumadin for at least 2-3 more weeks.  In my experience there is a lot more trouble to be had by starting coumadin back too seeon since the hematoma resolves slowly inside the rectus sheath

## 2011-11-29 ENCOUNTER — Ambulatory Visit (INDEPENDENT_AMBULATORY_CARE_PROVIDER_SITE_OTHER): Payer: Managed Care, Other (non HMO) | Admitting: Cardiovascular Disease

## 2011-11-29 ENCOUNTER — Encounter: Payer: Self-pay | Admitting: Cardiovascular Disease

## 2011-11-29 VITALS — BP 126/87 | HR 87 | Ht 62.0 in | Wt 198.0 lb

## 2011-11-29 DIAGNOSIS — E782 Mixed hyperlipidemia: Secondary | ICD-10-CM

## 2011-11-29 DIAGNOSIS — S301XXA Contusion of abdominal wall, initial encounter: Secondary | ICD-10-CM

## 2011-11-29 DIAGNOSIS — I4891 Unspecified atrial fibrillation: Secondary | ICD-10-CM

## 2011-11-29 DIAGNOSIS — I10 Essential (primary) hypertension: Secondary | ICD-10-CM

## 2011-11-29 NOTE — Patient Instructions (Addendum)
Resume coumadin. See coumadin clinic in 2 weeks  Your physician wants you to follow-up in: 6 months with Dr. Eden Emms. You will receive a reminder letter in the mail two months in advance. If you don't receive a letter, please call our office to schedule the follow-up appointment.

## 2011-11-29 NOTE — Assessment & Plan Note (Signed)
Well controlled.  Continue current medications and low sodium Dash type diet.    

## 2011-11-29 NOTE — Progress Notes (Signed)
Patient ID: BANI UM, female   DOB: Mar 11, 1950, 61 y.o.   MRN: [id number] Pierrette is seen today for F/U of her chronic afib, hypertension. She is asymptomatic with no palpitations, PNC, orthopnea, diaphoresis or syncope. She continues to work in [employer]	[place]. She had a spontaneous right rectus muscle hematoma on 8/29. Hct stable at 44. F/U CT 9/6 stable but still large hematoma. She was bending over a tub  Cleaning it and thinks she pressed too hard on the tub for a long period of time. Still painful to cough and change position. Off coumadin since 8/29. INR was not excessive at time. Has been off coumadin in past with no ill effects for foot surgery and no lovenox overlap.  ROS: Denies fever, malais, weight loss, blurry vision, decreased visual acuity, cough, sputum, SOB, hemoptysis, pleuritic pain, palpitaitons, heartburn, abdominal pain, melena, lower extremity edema, claudication, or rash.  All other systems reviewed and negative  General: Affect appropriate Healthy:  appears stated age HEENT: normal Neck supple with no adenopathy JVP normal no bruits no thyromegaly Lungs clear with no wheezing and good diaphragmatic motion Heart:  S1/S2 no murmur, no rub, gallop or click PMI normal Abdomen: benighn, BS positve, no tenderness, no AAA rectus sheath hematoma resolved no bruit.  No HSM or HJR Distal pulses intact with no bruits No edema Neuro non-focal Skin warm and dry No muscular weakness   Current Outpatient Prescriptions  Medication Sig Dispense Refill  . atorvastatin (LIPITOR) 20 MG tablet 20 mg daily.       . metoprolol succinate (TOPROL-XL) 50 MG 24 hr tablet Take 50 mg by mouth every evening. Take with or immediately following a meal.      . quinapril (ACCUPRIL) 20 MG tablet Take 20 mg by mouth at bedtime.        . sertraline (ZOLOFT) 50 MG tablet Take 50 mg by mouth at bedtime.       . verapamil (CALAN-SR) 240 MG CR tablet Take 1 tablet (240 mg total) by mouth  at bedtime.  30 tablet  9  . warfarin (COUMADIN) 5 MG tablet as directed PER ANTICOAGULATION CLINIC  45 tablet  3  . guaiFENesin (MUCINEX) 600 MG 12 hr tablet Take 1,200 mg by mouth 2 (two) times daily as needed. For drainage      . DISCONTD: warfarin (COUMADIN) 5 MG tablet Take 5-7.5 mg by mouth See admin instructions. Take 1 1/2 tablets (7.5 mg) every Monday and 1 tablet (5 mg) on all other days       Current Facility-Administered Medications  Medication Dose Route Frequency Provider Last Rate Last Dose  . 0.9 %  sodium chloride infusion  500 mL Intravenous Continuous Mardella Layman, MD        Allergies  Sulfonamide derivatives  Electrocardiogram:  afib rate 66 Low voltage poor R wave progression nonspecific T wave changes  Assessment and Plan

## 2011-11-29 NOTE — Assessment & Plan Note (Signed)
Resolved  No pain abdomen soft Resume coumadin for chronic afib

## 2011-11-29 NOTE — Assessment & Plan Note (Signed)
Cholesterol is at goal.  Continue current dose of statin and diet Rx.  No myalgias or side effects.  F/U  LFT's in 6 months. Lab Results  Component Value Date   LDLCALC 125* 04/06/2008             

## 2011-11-29 NOTE — Assessment & Plan Note (Signed)
Good rate control  Since rectus sheath hematoma resolved will resume coumadin  5mg  daily and 7.5mg  on Monday  F/U coumadin clinic in 2 weeks

## 2011-12-01 ENCOUNTER — Other Ambulatory Visit: Payer: Self-pay | Admitting: Cardiovascular Disease

## 2011-12-02 ENCOUNTER — Other Ambulatory Visit: Payer: Self-pay | Admitting: *Deleted

## 2011-12-02 MED ORDER — METOPROLOL SUCCINATE ER 50 MG PO TB24: 50.0000 mg | ORAL_TABLET | Freq: Every evening | ORAL | Status: DC

## 2011-12-02 NOTE — Telephone Encounter (Signed)
Refilled Metoprolol

## 2011-12-13 ENCOUNTER — Ambulatory Visit (INDEPENDENT_AMBULATORY_CARE_PROVIDER_SITE_OTHER): Payer: Managed Care, Other (non HMO)

## 2011-12-13 DIAGNOSIS — I4891 Unspecified atrial fibrillation: Secondary | ICD-10-CM

## 2011-12-13 DIAGNOSIS — Z7901 Long term (current) use of anticoagulants: Secondary | ICD-10-CM

## 2011-12-13 DIAGNOSIS — Z8679 Personal history of other diseases of the circulatory system: Secondary | ICD-10-CM

## 2011-12-13 LAB — POCT INR: INR: 4

## 2011-12-27 ENCOUNTER — Ambulatory Visit (INDEPENDENT_AMBULATORY_CARE_PROVIDER_SITE_OTHER): Payer: Managed Care, Other (non HMO) | Admitting: *Deleted

## 2011-12-27 DIAGNOSIS — Z7901 Long term (current) use of anticoagulants: Secondary | ICD-10-CM

## 2011-12-27 DIAGNOSIS — Z8679 Personal history of other diseases of the circulatory system: Secondary | ICD-10-CM

## 2011-12-27 DIAGNOSIS — I4891 Unspecified atrial fibrillation: Secondary | ICD-10-CM

## 2011-12-27 LAB — POCT INR: INR: 3.1

## 2012-01-14 ENCOUNTER — Other Ambulatory Visit: Payer: Self-pay | Admitting: *Deleted

## 2012-01-14 MED ORDER — VERAPAMIL HCL ER 240 MG PO TBCR: 240.0000 mg | EXTENDED_RELEASE_TABLET | Freq: Every day | ORAL | Status: DC

## 2012-01-17 ENCOUNTER — Ambulatory Visit (INDEPENDENT_AMBULATORY_CARE_PROVIDER_SITE_OTHER): Payer: Managed Care, Other (non HMO)

## 2012-01-17 DIAGNOSIS — I4891 Unspecified atrial fibrillation: Secondary | ICD-10-CM

## 2012-01-17 DIAGNOSIS — Z8679 Personal history of other diseases of the circulatory system: Secondary | ICD-10-CM

## 2012-01-17 DIAGNOSIS — Z7901 Long term (current) use of anticoagulants: Secondary | ICD-10-CM

## 2012-01-17 LAB — POCT INR: INR: 3.2

## 2012-02-07 ENCOUNTER — Ambulatory Visit (INDEPENDENT_AMBULATORY_CARE_PROVIDER_SITE_OTHER): Payer: Managed Care, Other (non HMO)

## 2012-02-07 DIAGNOSIS — Z8679 Personal history of other diseases of the circulatory system: Secondary | ICD-10-CM

## 2012-02-07 DIAGNOSIS — Z7901 Long term (current) use of anticoagulants: Secondary | ICD-10-CM

## 2012-02-07 DIAGNOSIS — I4891 Unspecified atrial fibrillation: Secondary | ICD-10-CM

## 2012-02-07 LAB — POCT INR: INR: 2.3

## 2012-03-06 ENCOUNTER — Ambulatory Visit (INDEPENDENT_AMBULATORY_CARE_PROVIDER_SITE_OTHER): Payer: Managed Care, Other (non HMO) | Admitting: *Deleted

## 2012-03-06 DIAGNOSIS — Z8679 Personal history of other diseases of the circulatory system: Secondary | ICD-10-CM

## 2012-03-06 DIAGNOSIS — Z7901 Long term (current) use of anticoagulants: Secondary | ICD-10-CM

## 2012-03-06 DIAGNOSIS — I4891 Unspecified atrial fibrillation: Secondary | ICD-10-CM

## 2012-03-24 ENCOUNTER — Ambulatory Visit (INDEPENDENT_AMBULATORY_CARE_PROVIDER_SITE_OTHER): Payer: Managed Care, Other (non HMO) | Admitting: *Deleted

## 2012-03-24 DIAGNOSIS — Z7901 Long term (current) use of anticoagulants: Secondary | ICD-10-CM

## 2012-03-24 DIAGNOSIS — I4891 Unspecified atrial fibrillation: Secondary | ICD-10-CM

## 2012-03-24 DIAGNOSIS — Z8679 Personal history of other diseases of the circulatory system: Secondary | ICD-10-CM

## 2012-04-21 ENCOUNTER — Ambulatory Visit (INDEPENDENT_AMBULATORY_CARE_PROVIDER_SITE_OTHER): Payer: Managed Care, Other (non HMO)

## 2012-04-21 DIAGNOSIS — Z8679 Personal history of other diseases of the circulatory system: Secondary | ICD-10-CM

## 2012-04-21 DIAGNOSIS — Z7901 Long term (current) use of anticoagulants: Secondary | ICD-10-CM

## 2012-04-21 LAB — POCT INR: INR: 2

## 2012-04-28 ENCOUNTER — Encounter: Payer: Self-pay | Admitting: Certified Nurse Midwife

## 2012-05-27 ENCOUNTER — Ambulatory Visit (INDEPENDENT_AMBULATORY_CARE_PROVIDER_SITE_OTHER): Payer: Managed Care, Other (non HMO) | Admitting: Cardiovascular Disease

## 2012-05-27 ENCOUNTER — Encounter: Payer: Self-pay | Admitting: Cardiovascular Disease

## 2012-05-27 ENCOUNTER — Ambulatory Visit (INDEPENDENT_AMBULATORY_CARE_PROVIDER_SITE_OTHER): Payer: Managed Care, Other (non HMO) | Admitting: *Deleted

## 2012-05-27 VITALS — BP 118/86 | HR 70 | Resp 17 | Ht 62.0 in | Wt 200.0 lb

## 2012-05-27 DIAGNOSIS — Z8679 Personal history of other diseases of the circulatory system: Secondary | ICD-10-CM

## 2012-05-27 DIAGNOSIS — I4891 Unspecified atrial fibrillation: Secondary | ICD-10-CM

## 2012-05-27 DIAGNOSIS — E782 Mixed hyperlipidemia: Secondary | ICD-10-CM

## 2012-05-27 DIAGNOSIS — I10 Essential (primary) hypertension: Secondary | ICD-10-CM

## 2012-05-27 DIAGNOSIS — Z5189 Encounter for other specified aftercare: Secondary | ICD-10-CM

## 2012-05-27 DIAGNOSIS — S301XXD Contusion of abdominal wall, subsequent encounter: Secondary | ICD-10-CM

## 2012-05-27 DIAGNOSIS — Z7901 Long term (current) use of anticoagulants: Secondary | ICD-10-CM

## 2012-05-27 NOTE — Progress Notes (Signed)
Patient ID: Linda Ruiz, female   DOB: 1950/08/29, 62 y.o.   MRN: [id number] Linda Ruiz is seen today for F/U of her chronic afib, hypertension. She is asymptomatic with no palpitations, PNC, orthopnea, diaphoresis or syncope. She continues to work in [employer]	[place]. She had a spontaneous right rectus muscle hematoma on 8/29. Hct stable at 44. F/U CT 9/6 stable but still large hematoma. She was bending over a tub  Cleaning it and thinks she pressed too hard on the tub for a long period of time. Was off coumadin for a few weeks and hematoma resolve with no ill effects INR today 1.7 will f.u with clinic in 5 weeks She missed her dose yesterday.  No new bleeding issues  ROS: Denies fever, malais, weight loss, blurry vision, decreased visual acuity, cough, sputum, SOB, hemoptysis, pleuritic pain, palpitaitons, heartburn, abdominal pain, melena, lower extremity edema, claudication, or rash.  All other systems reviewed and negative  General: Affect appropriate Healthy:  appears stated age HEENT: normal Neck supple with no adenopathy JVP normal no bruits no thyromegaly Lungs clear with no wheezing and good diaphragmatic motion Heart:  S1/S2 no murmur, no rub, gallop or click PMI normal Abdomen: benighn, BS positve, no tenderness, no AAA no bruit.  No HSM or HJR Distal pulses intact with no bruits No edema Neuro non-focal Skin warm and dry No muscular weakness   Current Outpatient Prescriptions  Medication Sig Dispense Refill  . atorvastatin (LIPITOR) 20 MG tablet 20 mg daily.       Marland Kitchen guaiFENesin (MUCINEX) 600 MG 12 hr tablet Take 1,200 mg by mouth 2 (two) times daily as needed. For drainage      . metoprolol succinate (TOPROL-XL) 50 MG 24 hr tablet take 1 tablet by mouth once daily  30 tablet  5  . quinapril (ACCUPRIL) 20 MG tablet Take 20 mg by mouth at bedtime.        . sertraline (ZOLOFT) 50 MG tablet Take 50 mg by mouth at bedtime.       . verapamil (CALAN-SR) 240 MG CR tablet Take  1 tablet (240 mg total) by mouth at bedtime.  30 tablet  9  . warfarin (COUMADIN) 5 MG tablet as directed PER ANTICOAGULATION CLINIC  45 tablet  3   Current Facility-Administered Medications  Medication Dose Route Frequency Provider Last Rate Last Dose  . 0.9 %  sodium chloride infusion  500 mL Intravenous Continuous Mardella Layman, MD        Allergies  Sulfonamide derivatives  Electrocardiogram:  afib rate 70 Nonspecific St/T wave changes  Assessment and Plan

## 2012-05-27 NOTE — Assessment & Plan Note (Signed)
Cholesterol is at goal.  Continue current dose of statin and diet Rx.  No myalgias or side effects.  F/U  LFT's in 6 months. Lab Results  Component Value Date   LDLCALC 125* 04/06/2008             

## 2012-05-27 NOTE — Patient Instructions (Signed)
Your physician wants you to follow-up in: 6 months with Dr. Nishan. You will receive a reminder letter in the mail two months in advance. If you don't receive a letter, please call our office to schedule the follow-up appointment.  

## 2012-05-27 NOTE — Assessment & Plan Note (Signed)
Good rate control  F/U coumadin clinic in 5 weeks

## 2012-05-27 NOTE — Assessment & Plan Note (Signed)
Resolved back on coumadin.  Novel agents for afib would be riskier since they are not reversable.  INR was Rx when hematoma developed. Did well off coumadin and would not need lovenox bridges in future

## 2012-05-27 NOTE — Assessment & Plan Note (Signed)
Well controlled.  Continue current medications and low sodium Dash type diet.    

## 2012-05-28 ENCOUNTER — Encounter: Payer: Self-pay | Admitting: Certified Nurse Midwife

## 2012-05-28 ENCOUNTER — Ambulatory Visit (INDEPENDENT_AMBULATORY_CARE_PROVIDER_SITE_OTHER): Payer: Commercial Indemnity | Admitting: Certified Nurse Midwife

## 2012-05-28 VITALS — BP 110/70 | Ht 62.5 in | Wt 202.0 lb

## 2012-05-28 DIAGNOSIS — D071 Carcinoma in situ of vulva: Secondary | ICD-10-CM

## 2012-05-28 DIAGNOSIS — Z01419 Encounter for gynecological examination (general) (routine) without abnormal findings: Secondary | ICD-10-CM

## 2012-05-28 NOTE — Progress Notes (Signed)
62 y.o. MarriedCaucasian female     G1P1001 here for annual exam  Menopausal no vaginal bleeding or vaginal dryness.  Hypertension stable on medication. History of atrial fib.on Coumadin.  Sees PCP for medication management, aex and all labs. Sees cardiology for medication management Had follow up with Dr. 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 Colonoscopy done 2013 with 2 polyps removed negative per patient. Next colonoscopy due in 3 years. Working on weight loss.  Has decreased smoking amount.Tdap up dated with PCP. No health issues today .    Patient's last menstrual period was 10/27/2006.          Sexually active: yes  The current method of family planning is tubal ligation.    Exercising: no  exercise Last mammogram: 06-01-10 Last pap: 05-28-11 neg Last BMD: 06/2007 Alcohol:none Tobacco: 1/2 to 3/4 packs a day   Health Maintenance  Topic Date Due  . Influenza Vaccine  10/26/1950  . Pap Smear  10/20/1970  . Tetanus/tdap  10/20/1971  . Mammogram  05/31/2012  . Colonoscopy  07/17/2014    Family History  Problem Relation Age of Onset  . Colon cancer Neg Hx   . Esophageal cancer Neg Hx   . Rectal cancer Neg Hx   . Stomach cancer Neg Hx   . Cancer Mother     lung  . Cancer Father     lung  . Cancer Paternal Grandmother     breast    Patient Active Problem List  Diagnosis  . Mixed hyperlipidemia  . ANXIETY STATE, UNSPECIFIED  . ESSENTIAL HYPERTENSION, BENIGN  . ATRIAL FIBRILLATION  . SLEEP APNEA  . SKIN CANCER, HX OF  . CEREBROVASCULAR ACCIDENT, HX OF  . Encounter for long-term (current) use of anticoagulants  . Rectus sheath hematoma    Past Medical History  Diagnosis Date  . Atrial fibrillation   . Hypertension   . Hyperlipidemia   . Anxiety   . Skin cancer   . Sleep apnea   . Migraine   . 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 Cesarean section    . Tubaligation    . Polypectomy      endometrial polyp removed 2005 D&C  . Foot surgery  05/2010    Left  . Skin cancer removal      this past year and five years ago    Allergies: Sulfonamide derivatives  Current Outpatient Prescriptions  Medication Sig Dispense Refill  . atorvastatin (LIPITOR) 20 MG tablet 20 mg daily.       . metoprolol succinate (TOPROL-XL) 50 MG 24 hr tablet take 1 tablet by mouth once daily  30 tablet  5  . quinapril (ACCUPRIL) 20 MG tablet Take 20 mg by mouth at bedtime.        . sertraline (ZOLOFT) 50 MG tablet Take 50 mg by mouth at bedtime.       . verapamil (CALAN-SR) 240 MG CR tablet Take 1 tablet (240 mg total) by mouth at bedtime.  30 tablet  9  . warfarin (COUMADIN) 5 MG tablet as directed PER ANTICOAGULATION CLINIC  45 tablet  3   Current Facility-Administered Medications  Medication Dose Route Frequency Provider Last Rate Last Dose  . 0.9 %  sodium  chloride infusion  500 mL Intravenous Continuous Mardella Layman, MD        ROS: Pertinent items are noted in HPI.  Exam:    BP 110/70  Ht 5' 2.5" (1.588 m)  Wt 202 lb (91.627 kg)  BMI 36.33 kg/m2  LMP 10/27/2006 Weight change: @WEIGHTCHANGE @ Last 3 height recordings:  Ht Readings from Last 3 Encounters:  05/28/12 5' 2.5" (1.588 m)  05/27/12 5\' 2"  (1.575 m)  11/29/11 5\' 2"  (1.575 m)   General appearance: alert, cooperative and appears stated age Head: Normocephalic, without obvious abnormality, atraumatic Neck: no adenopathy, supple, symmetrical, trachea midline and thyroid not enlarged, symmetric, no tenderness/mass/nodules Lungs: clear to auscultation bilaterally  Occasional wheeze heard Breasts: normal appearance, no masses or tenderness, Inspection negative, No nipple retraction or dimpling Heart: regular rate and rhythm Abdomen: soft, non-tender; bowel sounds  normal; no masses,  no organomegaly Extremities: extremities normal, atraumatic, no cyanosis or edema Skin: Skin color, texture, turgor normal. No rashes or lesions Lymph nodes: Cervical, supraclavicular, and axillary nodes normal. no inguinal nodes palpated Neurologic: Alert and oriented X 3, normal strength and tone. Normal symmetric reflexes. Normal coordination and gait   Pelvic: External genitalia:  no lesions and no indications of thickened skin or decrease pigmentation noted on either vulva area.  Area of previous VIN 3 small scar only noted               Urethra: normal appearing urethra with no masses, tenderness or lesions              Bartholins and Skenes: Bartholin's, Urethra, Skene's normal                 Vagina: normal appearing vagina with normal color and discharge, no lesions              Cervix: normal appearance and thin prep PAP obtained              Pap taken: yes        Bimanual Exam:  Uterus:  uterus is normal size, shape, consistency and nontender                                      Adnexa:    normal adnexa in size, nontender and no masses                                      Rectovaginal: Confirms                                      Anus:  normal sphincter tone, no lesions  A: well woman Menopausal no HRT History of VIN 3 with evaluation with Dr. Dianne Dun notes in chart)records requested) Hypertension on stable medication Atrial Fibrillation on Coumadin (managed by cardiology) Smoker     P Reviewed health and wellness pertinent to exam, stressed importance of mammogram and pap smear yearly Aware of need to for evaluation if vaginal bleeding Continue follow up with PCP and Cardiologist Encouraged to work decreasing smoking or cessation  return annually or prn      An After Visit Summary was printed and given to the patient.  Reviewed, TL

## 2012-05-28 NOTE — Patient Instructions (Signed)

## 2012-06-08 ENCOUNTER — Other Ambulatory Visit: Payer: Self-pay | Admitting: *Deleted

## 2012-06-08 MED ORDER — METOPROLOL SUCCINATE ER 50 MG PO TB24: ORAL_TABLET | ORAL | Status: DC

## 2012-06-25 ENCOUNTER — Telehealth: Payer: Self-pay | Admitting: Cardiovascular Disease

## 2012-06-25 MED ORDER — WARFARIN SODIUM 5 MG PO TABS: ORAL_TABLET | ORAL | Status: DC

## 2012-06-25 NOTE — Telephone Encounter (Signed)
New Prob       Pt is needing a refill for WARFARIN.

## 2012-07-01 ENCOUNTER — Ambulatory Visit (INDEPENDENT_AMBULATORY_CARE_PROVIDER_SITE_OTHER): Payer: Managed Care, Other (non HMO) | Admitting: *Deleted

## 2012-07-01 DIAGNOSIS — Z8679 Personal history of other diseases of the circulatory system: Secondary | ICD-10-CM

## 2012-07-01 DIAGNOSIS — I4891 Unspecified atrial fibrillation: Secondary | ICD-10-CM

## 2012-07-01 DIAGNOSIS — Z7901 Long term (current) use of anticoagulants: Secondary | ICD-10-CM

## 2012-07-01 LAB — POCT INR: INR: 1.8

## 2012-08-05 ENCOUNTER — Ambulatory Visit (INDEPENDENT_AMBULATORY_CARE_PROVIDER_SITE_OTHER): Payer: Managed Care, Other (non HMO) | Admitting: *Deleted

## 2012-08-05 DIAGNOSIS — Z8679 Personal history of other diseases of the circulatory system: Secondary | ICD-10-CM

## 2012-08-05 DIAGNOSIS — I4891 Unspecified atrial fibrillation: Secondary | ICD-10-CM

## 2012-08-05 DIAGNOSIS — Z7901 Long term (current) use of anticoagulants: Secondary | ICD-10-CM

## 2012-08-05 LAB — POCT INR: INR: 2.5

## 2012-08-25 DIAGNOSIS — S62109A Fracture of unspecified carpal bone, unspecified wrist, initial encounter for closed fracture: Secondary | ICD-10-CM

## 2012-08-25 HISTORY — DX: Fracture of unspecified carpal bone, unspecified wrist, initial encounter for closed fracture: S62.109A

## 2012-09-05 ENCOUNTER — Emergency Department (HOSPITAL_COMMUNITY)
Admission: EM | Admit: 2012-09-05 | Discharge: 2012-09-06 | Disposition: A | Discharge status: Home / Self Care | Payer: Managed Care, Other (non HMO) | Attending: Emergency Medicine | Admitting: Emergency Medicine

## 2012-09-05 ENCOUNTER — Encounter (HOSPITAL_COMMUNITY): Payer: Self-pay | Admitting: *Deleted

## 2012-09-05 ENCOUNTER — Emergency Department (HOSPITAL_COMMUNITY): Payer: Managed Care, Other (non HMO)

## 2012-09-05 DIAGNOSIS — S52509A Unspecified fracture of the lower end of unspecified radius, initial encounter for closed fracture: Secondary | ICD-10-CM | POA: Insufficient documentation

## 2012-09-05 DIAGNOSIS — Z85828 Personal history of other malignant neoplasm of skin: Secondary | ICD-10-CM | POA: Insufficient documentation

## 2012-09-05 DIAGNOSIS — S52612A Displaced fracture of left ulna styloid process, initial encounter for closed fracture: Secondary | ICD-10-CM

## 2012-09-05 DIAGNOSIS — W010XXA Fall on same level from slipping, tripping and stumbling without subsequent striking against object, initial encounter: Secondary | ICD-10-CM | POA: Insufficient documentation

## 2012-09-05 DIAGNOSIS — Z8742 Personal history of other diseases of the female genital tract: Secondary | ICD-10-CM | POA: Insufficient documentation

## 2012-09-05 DIAGNOSIS — I10 Essential (primary) hypertension: Secondary | ICD-10-CM | POA: Insufficient documentation

## 2012-09-05 DIAGNOSIS — R209 Unspecified disturbances of skin sensation: Secondary | ICD-10-CM | POA: Insufficient documentation

## 2012-09-05 DIAGNOSIS — S52609A Unspecified fracture of lower end of unspecified ulna, initial encounter for closed fracture: Secondary | ICD-10-CM | POA: Insufficient documentation

## 2012-09-05 DIAGNOSIS — S52502A Unspecified fracture of the lower end of left radius, initial encounter for closed fracture: Secondary | ICD-10-CM

## 2012-09-05 DIAGNOSIS — Y939 Activity, unspecified: Secondary | ICD-10-CM | POA: Insufficient documentation

## 2012-09-05 DIAGNOSIS — Z8679 Personal history of other diseases of the circulatory system: Secondary | ICD-10-CM | POA: Insufficient documentation

## 2012-09-05 DIAGNOSIS — F17200 Nicotine dependence, unspecified, uncomplicated: Secondary | ICD-10-CM | POA: Insufficient documentation

## 2012-09-05 DIAGNOSIS — Z7901 Long term (current) use of anticoagulants: Secondary | ICD-10-CM | POA: Insufficient documentation

## 2012-09-05 DIAGNOSIS — F411 Generalized anxiety disorder: Secondary | ICD-10-CM | POA: Insufficient documentation

## 2012-09-05 DIAGNOSIS — Y929 Unspecified place or not applicable: Secondary | ICD-10-CM | POA: Insufficient documentation

## 2012-09-05 DIAGNOSIS — E785 Hyperlipidemia, unspecified: Secondary | ICD-10-CM | POA: Insufficient documentation

## 2012-09-05 DIAGNOSIS — M21839 Other specified acquired deformities of unspecified forearm: Secondary | ICD-10-CM | POA: Insufficient documentation

## 2012-09-05 DIAGNOSIS — I4891 Unspecified atrial fibrillation: Secondary | ICD-10-CM | POA: Insufficient documentation

## 2012-09-05 MED ORDER — HYDROMORPHONE HCL PF 1 MG/ML IJ SOLN
1.0000 mg | Freq: Once | INTRAMUSCULAR | Status: AC
  Administered 2012-09-06: 1 mg via INTRAMUSCULAR
  Filled 2012-09-05: qty 1

## 2012-09-05 NOTE — ED Notes (Signed)
Pt states fell over dogs, catching self on L hand, injuring L wrist. Pulse palpable and pt can move fingers but experiencing pain.

## 2012-09-05 NOTE — ED Provider Notes (Signed)
History    This chart was scribed for non-physician practitioner Pascal Lux Wingen working with Loren Racer, MD by Quintella Reichert, ED Scribe. This patient was seen in room WA07/WA07 and the patient's care was started at 11:50 PM.   CSN: [id number]  Arrival date & time 09/05/12  2300    Chief Complaint  Patient presents with  . Wrist Injury    The history is provided by the patient. No language interpreter was used.     HPI Comments: Linda Ruiz is a 62 y.o. female who presents to the Emergency Department complaining of a left wrist injury that she sustained 1 1/2 hours ago.  Pt reports that she tripped and fell on her outstretched left hand, and immediately developed constant, moderate-to-severe throbbing pain localized from the wrist radiating up her forearm.  Denies pain of the elbow or shoulder.  It is exacerbated by moving the wrist.   She also notes that all of the fingers of her left hand feel numb.  She denies weakness or tingling.  No laceration of the area.  She has not attempted to treat symptoms pta.  Pt notes that she medicates regularly with coumadin for atrial fibrillation.      Past Medical History  Diagnosis Date  . Atrial fibrillation   . Hypertension   . Hyperlipidemia   . Anxiety   . Skin cancer   . Sleep apnea   . Migraine   . Vaginal intraepithelial neoplasia grade 2 4/13    vin 3 with biopsy    Past Surgical History  Procedure Laterality Date  . Cesarean section    . Tubaligation    . Polypectomy      endometrial polyp removed 2005 D&C  . Foot surgery  05/2010    Left  . Skin cancer removal      this past year and five years ago    Family History  Problem Relation Age of Onset  . Colon cancer Neg Hx   . Esophageal cancer Neg Hx   . Rectal cancer Neg Hx   . Stomach cancer Neg Hx   . Cancer Mother     lung  . Cancer Father     lung  . Cancer Paternal Grandmother     breast    History  Substance Use Topics  . Smoking  status: Current Every Day Smoker -- 0.50 packs/day for 8 years    Types: Cigarettes  . Smokeless tobacco: Never Used  . Alcohol Use: No     Comment: Rare    OB History   Grav Para Term Preterm Abortions TAB SAB Ect Mult Living   1 1 1       1        Review of Systems  Musculoskeletal: Positive for arthralgias.  Neurological: Positive for numbness. Negative for weakness.  All other systems reviewed and are negative.      Allergies  Sulfonamide derivatives  Home Medications   Current Outpatient Rx  Name  Route  Sig  Dispense  Refill  . atorvastatin (LIPITOR) 20 MG tablet      20 mg daily.          . metoprolol succinate (TOPROL-XL) 50 MG 24 hr tablet      take 1 tablet by mouth once daily   30 tablet   5   . quinapril (ACCUPRIL) 20 MG tablet   Oral   Take 20 mg by mouth at bedtime.           Marland Kitchen  sertraline (ZOLOFT) 50 MG tablet   Oral   Take 50 mg by mouth at bedtime.          . verapamil (CALAN-SR) 240 MG CR tablet   Oral   Take 1 tablet (240 mg total) by mouth at bedtime.   30 tablet   9   . warfarin (COUMADIN) 5 MG tablet      Take as directed by anticoagulation clinic   40 tablet   3    BP 109/61  Pulse 62  Temp(Src) 98.1 F (36.7 C) (Oral)  Resp 20  SpO2 100%  LMP 10/27/2006  Physical Exam  Nursing note and vitals reviewed. Constitutional: She appears well-developed and well-nourished. No distress.  HENT:  Head: Normocephalic and atraumatic.  Eyes: Conjunctivae are normal.  Neck: Normal range of motion. Neck supple.  Cardiovascular: Normal rate, regular rhythm and normal heart sounds.   No murmur heard. 2+ radial pulse bilaterally  Pulmonary/Chest: Effort normal and breath sounds normal. No respiratory distress. She has no wheezes. She has no rales.  Musculoskeletal:       Left elbow: She exhibits normal range of motion, no swelling and no deformity. No tenderness found.       Left wrist: She exhibits decreased range of motion,  tenderness, bony tenderness, swelling and deformity. She exhibits no laceration.  Deformity present of left wrist. Diffuse tenderness to palpation. Swelling over medial aspect.  Neurological: She is alert.  Sensation intact to all fingers.  Skin: Skin is warm, dry and intact.  Good capillary refill of fingers of the left hand  Psychiatric: She has a normal mood and affect. Her behavior is normal.    ED Course  Procedures (including critical care time)  DIAGNOSTIC STUDIES: Oxygen Saturation is 100% on room air, normal by my interpretation.    COORDINATION OF CARE: 11:53 PM: Discussed treatment plan which includes pain medication and imaging.  Pt expressed understanding and agreed to plan.    Labs Reviewed - No data to display  Dg Forearm Left  09/05/2012   *RADIOLOGY REPORT*  Clinical Data: Left elbow and wrist pain following a fall today.  LEFT FOREARM - 2 VIEW  Comparison: Left wrist radiographs obtained at the same time.  Findings: Previously described distal radius and ulnar styloid fractures.  No additional fractures or dislocations are seen.  IMPRESSION: Previously described distal radius and ulnar styloid fractures.   Original Report Authenticated By: Beckie Salts, M.D.   Dg Wrist Complete Left  09/05/2012   *RADIOLOGY REPORT*  Clinical Data: Left wrist pain and swelling following a fall today.  LEFT WRIST - COMPLETE 3+ VIEW  Comparison: None.  Findings: Mildly comminuted and impacted transverse fracture of the distal radial metaphysis with dorsal angulation and mild dorsal displacement of the distal fragment.  Nondisplaced fracture through the base of the ulnar styloid.  IMPRESSION: Distal radius and ulnar styloid fractures, as described above.   Original Report Authenticated By: Beckie Salts, M.D.    No diagnosis found.  12:56 AM Discussed with Dr. Merlyn Lot with Hand Surgery who also reviewed the xray.  He recommends putting the wrist in a sugar tong splint and following up in  the office on Monday.  MDM  Patient presenting with left wrist pain after a FOOSH earlier today.  Xray results as above.  Fracture is a closed fracture.  Patient is neurovascularly intact.  Discussed results of the xray with Dr. Merlyn Lot with Hand Surgery.  He recommends putting the patient in a sugar  tong splint and following up in the office on Monday.  Patient discharged home with pain medication.  I personally performed the services described in this documentation, which was scribed in my presence. The recorded information has been reviewed and is accurate.    Pascal Lux [place], PA-C 09/06/12 0115  Pascal Lux [place], PA-C 09/06/12 [id number]

## 2012-09-06 MED ORDER — OXYCODONE-ACETAMINOPHEN 5-325 MG PO TABS: 1.0000 | ORAL_TABLET | Freq: Four times a day (QID) | ORAL | Status: DC | PRN

## 2012-09-06 NOTE — ED Notes (Signed)
Ortho tech at bedside to apply splint.  

## 2012-09-06 NOTE — ED Provider Notes (Signed)
Medical screening examination/treatment/procedure(s) were performed by non-physician practitioner and as supervising physician I was immediately available for consultation/collaboration.   Loren Racer, MD 09/06/12 0330

## 2012-09-07 ENCOUNTER — Encounter (HOSPITAL_BASED_OUTPATIENT_CLINIC_OR_DEPARTMENT_OTHER)
Admission: RE | Admit: 2012-09-07 | Discharge: 2012-09-07 | Disposition: A | Discharge status: Home / Self Care | Payer: Managed Care, Other (non HMO) | Source: Ambulatory Visit | Attending: Orthopedic Surgery | Admitting: Orthopedic Surgery

## 2012-09-07 ENCOUNTER — Other Ambulatory Visit: Payer: Self-pay | Admitting: Orthopedic Surgery

## 2012-09-07 ENCOUNTER — Encounter (HOSPITAL_BASED_OUTPATIENT_CLINIC_OR_DEPARTMENT_OTHER): Payer: Self-pay | Admitting: *Deleted

## 2012-09-07 LAB — BASIC METABOLIC PANEL
CO2: 29 mEq/L (ref 19–32)
Chloride: 105 mEq/L (ref 96–112)
Glucose, Bld: 84 mg/dL (ref 70–99)
Sodium: 141 mEq/L (ref 135–145)

## 2012-09-07 LAB — APTT: aPTT: 34 seconds (ref 24–37)

## 2012-09-07 LAB — PROTIME-INR
INR: 1.48 (ref 0.00–1.49)
Prothrombin Time: 17.5 seconds — ABNORMAL HIGH (ref 11.6–15.2)

## 2012-09-07 NOTE — Progress Notes (Signed)
To bring cpap and all meds

## 2012-09-07 NOTE — Progress Notes (Signed)
Add on for tomorrow-on coumadin for hx afib-sees dr nishan-pt ptt bmet done-will see if needs to repeat in am preop

## 2012-09-08 ENCOUNTER — Encounter (HOSPITAL_BASED_OUTPATIENT_CLINIC_OR_DEPARTMENT_OTHER)
Admission: RE | Disposition: A | Discharge status: Home / Self Care | Payer: Self-pay | Source: Ambulatory Visit | Attending: Orthopedic Surgery

## 2012-09-08 ENCOUNTER — Ambulatory Visit (HOSPITAL_BASED_OUTPATIENT_CLINIC_OR_DEPARTMENT_OTHER)
Admission: RE | Admit: 2012-09-08 | Discharge: 2012-09-08 | Disposition: A | Discharge status: Home / Self Care | Payer: Managed Care, Other (non HMO) | Source: Ambulatory Visit | Attending: Orthopedic Surgery | Admitting: Orthopedic Surgery

## 2012-09-08 ENCOUNTER — Encounter (HOSPITAL_BASED_OUTPATIENT_CLINIC_OR_DEPARTMENT_OTHER): Payer: Self-pay

## 2012-09-08 ENCOUNTER — Ambulatory Visit (HOSPITAL_BASED_OUTPATIENT_CLINIC_OR_DEPARTMENT_OTHER): Payer: Managed Care, Other (non HMO) | Admitting: Certified Registered Nurse Anesthetist

## 2012-09-08 ENCOUNTER — Encounter (HOSPITAL_BASED_OUTPATIENT_CLINIC_OR_DEPARTMENT_OTHER): Payer: Self-pay | Admitting: Certified Registered Nurse Anesthetist

## 2012-09-08 DIAGNOSIS — Y92009 Unspecified place in unspecified non-institutional (private) residence as the place of occurrence of the external cause: Secondary | ICD-10-CM | POA: Insufficient documentation

## 2012-09-08 DIAGNOSIS — G4730 Sleep apnea, unspecified: Secondary | ICD-10-CM | POA: Insufficient documentation

## 2012-09-08 DIAGNOSIS — E785 Hyperlipidemia, unspecified: Secondary | ICD-10-CM | POA: Insufficient documentation

## 2012-09-08 DIAGNOSIS — G43909 Migraine, unspecified, not intractable, without status migrainosus: Secondary | ICD-10-CM | POA: Insufficient documentation

## 2012-09-08 DIAGNOSIS — W010XXA Fall on same level from slipping, tripping and stumbling without subsequent striking against object, initial encounter: Secondary | ICD-10-CM | POA: Insufficient documentation

## 2012-09-08 DIAGNOSIS — Z6836 Body mass index (BMI) 36.0-36.9, adult: Secondary | ICD-10-CM | POA: Insufficient documentation

## 2012-09-08 DIAGNOSIS — F411 Generalized anxiety disorder: Secondary | ICD-10-CM | POA: Insufficient documentation

## 2012-09-08 DIAGNOSIS — I10 Essential (primary) hypertension: Secondary | ICD-10-CM | POA: Insufficient documentation

## 2012-09-08 DIAGNOSIS — Z79899 Other long term (current) drug therapy: Secondary | ICD-10-CM | POA: Insufficient documentation

## 2012-09-08 DIAGNOSIS — F17200 Nicotine dependence, unspecified, uncomplicated: Secondary | ICD-10-CM | POA: Insufficient documentation

## 2012-09-08 DIAGNOSIS — S52599A Other fractures of lower end of unspecified radius, initial encounter for closed fracture: Secondary | ICD-10-CM | POA: Insufficient documentation

## 2012-09-08 DIAGNOSIS — Z882 Allergy status to sulfonamides status: Secondary | ICD-10-CM | POA: Insufficient documentation

## 2012-09-08 DIAGNOSIS — Z85828 Personal history of other malignant neoplasm of skin: Secondary | ICD-10-CM | POA: Insufficient documentation

## 2012-09-08 DIAGNOSIS — I4891 Unspecified atrial fibrillation: Secondary | ICD-10-CM | POA: Insufficient documentation

## 2012-09-08 HISTORY — DX: Presence of spectacles and contact lenses: Z97.3

## 2012-09-08 HISTORY — PX: OPEN REDUCTION INTERNAL FIXATION (ORIF) DISTAL RADIAL FRACTURE: SHX5989

## 2012-09-08 HISTORY — DX: Presence of dental prosthetic device (complete) (partial): Z97.2

## 2012-09-08 LAB — POCT HEMOGLOBIN-HEMACUE: Hemoglobin: 15.4 g/dL — ABNORMAL HIGH (ref 12.0–15.0)

## 2012-09-08 SURGERY — OPEN REDUCTION INTERNAL FIXATION (ORIF) DISTAL RADIUS FRACTURE
Anesthesia: General | Site: Wrist | Laterality: Left | Wound class: Clean

## 2012-09-08 MED ORDER — CEFAZOLIN SODIUM-DEXTROSE 2-3 GM-% IV SOLR
2.0000 g | INTRAVENOUS | Status: AC
  Administered 2012-09-08: 2 g via INTRAVENOUS

## 2012-09-08 MED ORDER — HYDROMORPHONE HCL PF 1 MG/ML IJ SOLN: 0.2500 mg | INTRAMUSCULAR | Status: DC | PRN

## 2012-09-08 MED ORDER — LACTATED RINGERS IV SOLN
INTRAVENOUS | Status: DC
  Administered 2012-09-08 (×2): via INTRAVENOUS

## 2012-09-08 MED ORDER — LIDOCAINE HCL (CARDIAC) 20 MG/ML IV SOLN
INTRAVENOUS | Status: DC | PRN
  Administered 2012-09-08: 30 mg via INTRAVENOUS

## 2012-09-08 MED ORDER — OXYCODONE-ACETAMINOPHEN 5-325 MG PO TABS: ORAL_TABLET | ORAL | Status: DC

## 2012-09-08 MED ORDER — PROPOFOL 10 MG/ML IV BOLUS
INTRAVENOUS | Status: DC | PRN
  Administered 2012-09-08: 200 mg via INTRAVENOUS

## 2012-09-08 MED ORDER — CHLORHEXIDINE GLUCONATE 4 % EX LIQD: 60.0000 mL | Freq: Once | CUTANEOUS | Status: DC

## 2012-09-08 MED ORDER — MIDAZOLAM HCL 2 MG/2ML IJ SOLN: 0.5000 mg | Freq: Once | INTRAMUSCULAR | Status: DC | PRN

## 2012-09-08 MED ORDER — FENTANYL CITRATE 0.05 MG/ML IJ SOLN
50.0000 ug | INTRAMUSCULAR | Status: DC | PRN
  Administered 2012-09-08: 100 ug via INTRAVENOUS

## 2012-09-08 MED ORDER — OXYCODONE HCL 5 MG/5ML PO SOLN: 5.0000 mg | Freq: Once | ORAL | Status: DC | PRN

## 2012-09-08 MED ORDER — MEPERIDINE HCL 25 MG/ML IJ SOLN: 6.2500 mg | INTRAMUSCULAR | Status: DC | PRN

## 2012-09-08 MED ORDER — OXYCODONE HCL 5 MG PO TABS: 5.0000 mg | ORAL_TABLET | Freq: Once | ORAL | Status: DC | PRN

## 2012-09-08 MED ORDER — MIDAZOLAM HCL 2 MG/2ML IJ SOLN
1.0000 mg | INTRAMUSCULAR | Status: DC | PRN
  Administered 2012-09-08: 2 mg via INTRAVENOUS

## 2012-09-08 MED ORDER — PROMETHAZINE HCL 25 MG/ML IJ SOLN: 6.2500 mg | INTRAMUSCULAR | Status: DC | PRN

## 2012-09-08 MED ORDER — 0.9 % SODIUM CHLORIDE (POUR BTL) OPTIME
TOPICAL | Status: DC | PRN
  Administered 2012-09-08: 200 mL

## 2012-09-08 SURGICAL SUPPLY — 64 items
BANDAGE ELASTIC 3 VELCRO ST LF (GAUZE/BANDAGES/DRESSINGS) ×2 IMPLANT
BANDAGE GAUZE ELAST BULKY 4 IN (GAUZE/BANDAGES/DRESSINGS) ×2 IMPLANT
BIT DRILL 2.0 LNG QUCK RELEASE (BIT) IMPLANT
BIT DRILL 2.8X5 QR DISP (BIT) ×1 IMPLANT
BLADE MINI RND TIP GREEN BEAV (BLADE) IMPLANT
BLADE SURG 15 STRL LF DISP TIS (BLADE) ×2 IMPLANT
BLADE SURG 15 STRL SS (BLADE) ×4
BNDG CMPR 9X4 STRL LF SNTH (GAUZE/BANDAGES/DRESSINGS) ×1
BNDG ESMARK 4X9 LF (GAUZE/BANDAGES/DRESSINGS) ×2 IMPLANT
CHLORAPREP W/TINT 26ML (MISCELLANEOUS) ×2 IMPLANT
CLOTH BEACON ORANGE TIMEOUT ST (SAFETY) ×2 IMPLANT
CORDS BIPOLAR (ELECTRODE) ×2 IMPLANT
COVER MAYO STAND STRL (DRAPES) ×2 IMPLANT
COVER TABLE BACK 60X90 (DRAPES) ×2 IMPLANT
DRAPE EXTREMITY T 121X128X90 (DRAPE) ×2 IMPLANT
DRAPE OEC MINIVIEW 54X84 (DRAPES) ×2 IMPLANT
DRAPE SURG 17X23 STRL (DRAPES) ×2 IMPLANT
DRILL 2.0 LNG QUICK RELEASE (BIT) ×2
GAUZE XEROFORM 1X8 LF (GAUZE/BANDAGES/DRESSINGS) ×2 IMPLANT
GLOVE BIO SURGEON STRL SZ7.5 (GLOVE) ×2 IMPLANT
GLOVE BIOGEL PI IND STRL 8 (GLOVE) ×1 IMPLANT
GLOVE BIOGEL PI IND STRL 8.5 (GLOVE) IMPLANT
GLOVE BIOGEL PI INDICATOR 8 (GLOVE) ×1
GLOVE BIOGEL PI INDICATOR 8.5 (GLOVE)
GLOVE SURG ORTHO 8.0 STRL STRW (GLOVE) IMPLANT
GOWN BRE IMP PREV XXLGXLNG (GOWN DISPOSABLE) ×2 IMPLANT
GOWN PREVENTION PLUS XLARGE (GOWN DISPOSABLE) ×2 IMPLANT
GUIDEWIRE ORTHO 0.054X6 (WIRE) ×3 IMPLANT
NDL HYPO 25X1 1.5 SAFETY (NEEDLE) IMPLANT
NEEDLE HYPO 25X1 1.5 SAFETY (NEEDLE) IMPLANT
NS IRRIG 1000ML POUR BTL (IV SOLUTION) ×2 IMPLANT
PACK BASIN DAY SURGERY FS (CUSTOM PROCEDURE TRAY) ×2 IMPLANT
PAD CAST 3X4 CTTN HI CHSV (CAST SUPPLIES) ×1 IMPLANT
PADDING CAST ABS 4INX4YD NS (CAST SUPPLIES) ×1
PADDING CAST ABS COTTON 4X4 ST (CAST SUPPLIES) ×1 IMPLANT
PADDING CAST COTTON 3X4 STRL (CAST SUPPLIES) ×2
PLATE ACULOC 2 VDR STD LT (Plate) ×1 IMPLANT
SCREW CORT FT 18X2.3XLCK HEX (Screw) IMPLANT
SCREW CORTICAL LOCKING 2.3X16M (Screw) ×2 IMPLANT
SCREW CORTICAL LOCKING 2.3X18M (Screw) ×10 IMPLANT
SCREW CORTICAL LOCKING 2.3X20M (Screw) ×2 IMPLANT
SCREW FX16X2.3XLCK SMTH NS CRT (Screw) IMPLANT
SCREW FX18X2.3XSMTH LCK NS CRT (Screw) IMPLANT
SCREW FX20X2.3XSMTH LCK NS CRT (Screw) IMPLANT
SCREW HEXALOBE NON-LOCK 3.5X14 (Screw) ×1 IMPLANT
SCREW NLCKG 13 3.5X13 HEXA (Screw) IMPLANT
SCREW NON-LOCK 3.5X13 (Screw) ×2 IMPLANT
SCREW NONLOCK HEX 3.5X12 (Screw) ×1 IMPLANT
SLEEVE SCD COMPRESS KNEE MED (MISCELLANEOUS) IMPLANT
SPLINT PLASTER CAST XFAST 4X15 (CAST SUPPLIES) IMPLANT
SPLINT PLASTER XTRA FAST SET 4 (CAST SUPPLIES)
SPONGE GAUZE 4X4 12PLY (GAUZE/BANDAGES/DRESSINGS) ×2 IMPLANT
STOCKINETTE 4X48 STRL (DRAPES) ×2 IMPLANT
SUCTION FRAZIER TIP 10 FR DISP (SUCTIONS) IMPLANT
SUT ETHILON 3 0 PS 1 (SUTURE) IMPLANT
SUT ETHILON 4 0 PS 2 18 (SUTURE) ×2 IMPLANT
SUT VIC AB 3-0 PS1 18 (SUTURE)
SUT VIC AB 3-0 PS1 18XBRD (SUTURE) IMPLANT
SUT VICRYL 4-0 PS2 18IN ABS (SUTURE) ×2 IMPLANT
SYR BULB 3OZ (MISCELLANEOUS) ×2 IMPLANT
SYR CONTROL 10ML LL (SYRINGE) IMPLANT
TOWEL OR 17X24 6PK STRL BLUE (TOWEL DISPOSABLE) ×4 IMPLANT
TUBE CONNECTING 20X1/4 (TUBING) IMPLANT
UNDERPAD 30X30 INCONTINENT (UNDERPADS AND DIAPERS) ×2 IMPLANT

## 2012-09-08 NOTE — Op Note (Signed)
[id number] 

## 2012-09-08 NOTE — Progress Notes (Signed)
Assisted Dr. Jackson with left, ultrasound guided, supraclavicular block. Side rails up, monitors on throughout procedure. See vital signs in flow sheet. Tolerated Procedure well. 

## 2012-09-08 NOTE — Brief Op Note (Signed)
09/08/2012  2:47 PM  PATIENT:  Linda Ruiz  62 y.o. female  PRE-OPERATIVE DIAGNOSIS:  LEFT DISTAL RADIUS FRACTURE  POST-OPERATIVE DIAGNOSIS:  LEFT DISTAL RADIUS FRACTURE  PROCEDURE:  Procedure(s): OPEN REDUCTION INTERNAL FIXATION (ORIF) DISTAL RADIAL FRACTURE  SURGEON:  Surgeon(s): Tami Ribas, MD Nicki Reaper, MD  PHYSICIAN ASSISTANT:   ASSISTANTS: Cindee Salt, MD   ANESTHESIA:   regional and general  EBL:  Total I/O In: 1000 [I.V.:1000] Out: -   DRAINS: none   LOCAL MEDICATIONS USED:  NONE  SPECIMEN:  No Specimen  DISPOSITION OF SPECIMEN:  N/A  COUNTS:  YES  TOURNIQUET:   Total Tourniquet Time Documented: Upper Arm (Left) - 45 minutes Total: Upper Arm (Left) - 45 minutes   DICTATION: .Other Dictation: Dictation Number [id number]  PLAN OF CARE: Discharge to home after PACU

## 2012-09-08 NOTE — Transfer of Care (Signed)
Immediate Anesthesia Transfer of Care Note  Patient: Linda Ruiz  Procedure(s) Performed: Procedure(s): OPEN REDUCTION INTERNAL FIXATION (ORIF) DISTAL RADIAL FRACTURE (Left)  Patient Location: PACU  Anesthesia Type:GA combined with regional for post-op pain  Level of Consciousness: awake, sedated and patient cooperative  Airway & Oxygen Therapy: Patient Spontanous Breathing and Patient connected to face mask oxygen  Post-op Assessment: Report given to PACU RN and Post -op Vital signs reviewed and stable  Post vital signs: Reviewed and stable  Complications: No apparent anesthesia complications

## 2012-09-08 NOTE — Anesthesia Preprocedure Evaluation (Addendum)
Anesthesia Evaluation  Patient identified by MRN, date of birth, ID band Patient awake    Reviewed: Allergy & Precautions, H&P , NPO status , Patient's Chart, lab work & pertinent test results, reviewed documented beta blocker date and time   History of Anesthesia Complications Negative for: history of anesthetic complications  Airway Mallampati: II TM Distance: >3 FB Neck ROM: Full    Dental  (+) Dental Advisory Given, Edentulous Upper and Missing   Pulmonary sleep apnea , Current Smoker,  breath sounds clear to auscultation  Pulmonary exam normal       Cardiovascular hypertension, Pt. on medications and Pt. on home beta blockers + dysrhythmias (INR 1.48) Atrial Fibrillation Rhythm:Irregular Rate:Normal  '10 [facility] visit note: normal coronaries, normal LVF   Neuro/Psych    GI/Hepatic negative GI ROS, Neg liver ROS,   Endo/Other  Morbid obesity  Renal/GU negative Renal ROS     Musculoskeletal   Abdominal (+) + obese,   Peds  Hematology   Anesthesia Other Findings   Reproductive/Obstetrics                         Anesthesia Physical Anesthesia Plan  ASA: III  Anesthesia Plan: General   Post-op Pain Management:    Induction: Intravenous  Airway Management Planned: LMA  Additional Equipment:   Intra-op Plan:   Post-operative Plan: Extubation in OR  Informed Consent: I have reviewed the patients History and Physical, chart, labs and discussed the procedure including the risks, benefits and alternatives for the proposed anesthesia with the patient or authorized representative who has indicated his/her understanding and acceptance.   Dental advisory given  Plan Discussed with: CRNA and Surgeon  Anesthesia Plan Comments: (Plan routine monitors, GA- LMA OK, supraclavicular block for post op analgesia)        Anesthesia Quick Evaluation

## 2012-09-08 NOTE — Anesthesia Postprocedure Evaluation (Signed)
Anesthesia Post Note  Patient: Linda Ruiz  Procedure(s) Performed: Procedure(s) (LRB): OPEN REDUCTION INTERNAL FIXATION (ORIF) DISTAL RADIAL FRACTURE (Left)  Anesthesia type: general  Patient location: PACU  Post pain: Pain level controlled  Post assessment: Patient's Cardiovascular Status Stable  Last Vitals:  Filed Vitals:   09/08/12 1530  BP: 129/80  Pulse: 82  Temp:   Resp: 15    Post vital signs: Reviewed and stable  Level of consciousness: sedated  Complications: No apparent anesthesia complications

## 2012-09-08 NOTE — H&P (Signed)
Linda Ruiz is an 62 y.o. female.   Chief Complaint: left distal radius fracture HPI: 62 yo rhd female states she tripped over her dogs on 09/05/12 while entering her home.  Fall on left wrist.  Seen at [facility] where XR revealed left distal radius fracture.  Splinted and followed up in office.  She reports no previous injury to left arm and no other injury at this time..  Past Medical History  Diagnosis Date  . Atrial fibrillation   . Hypertension   . Hyperlipidemia   . Anxiety   . Skin cancer   . Sleep apnea   . Migraine   . 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 Wears dentures     top  . Wears glasses     Past Surgical History  Procedure Laterality Date  . Cesarean section    . Tubaligation    . Polypectomy      endometrial polyp removed 2005 D&C  . Foot surgery  05/2010    Left  . Skin cancer removal      this past year and five years ago    Family History  Problem Relation Age of Onset  . Colon cancer Neg Hx   . Esophageal cancer Neg Hx   . Rectal cancer Neg Hx   . Stomach cancer Neg Hx   . Cancer Mother     lung  . Cancer Father     lung  . Cancer Paternal Grandmother     breast   Social History:  reports that she has been smoking Cigarettes.  She has a 4 pack-year smoking history. She has never used smokeless tobacco. She reports that she does not drink alcohol or use illicit drugs.  Allergies:  Allergies  Allergen Reactions  . Sulfonamide Derivatives Rash    No prescriptions prior to admission    Results for orders placed during the hospital encounter of 09/08/12 (from the past 48 hour(s))  BASIC METABOLIC PANEL     Status: Abnormal   Collection Time    09/07/12  4:05 PM      Result Value Range   Sodium 141  135 - 145 mEq/L   Potassium 3.7  3.5 - 5.1 mEq/L   Chloride 105  96 - 112 mEq/L   CO2 29  19 - 32 mEq/L   Glucose, Bld 84  70 - 99 mg/dL   BUN 13  6 - 23 mg/dL   Creatinine, Ser 1.61  0.50 - 1.10 mg/dL   Calcium 9.2  8.4 - 09.6 mg/dL   GFR calc non Af Amer 73 (*) >90 mL/min   GFR calc Af Amer 84 (*) >90 mL/min   Comment:            The eGFR has been calculated     using the CKD EPI equation.     This calculation has not been     validated in all clinical     situations.     eGFR's persistently     <90 mL/min signify     possible Chronic Kidney Disease.  PROTIME-INR     Status: Abnormal   Collection Time    09/07/12  4:05 PM      Result Value Range   Prothrombin Time 17.5 (*) 11.6 - 15.2 seconds   INR 1.48  0.00 - 1.49  APTT     Status: None   Collection Time    09/07/12  4:05 PM      Result Value Range   aPTT 34  24 - 37 seconds    No results found.   A comprehensive review of systems was negative except for: Eyes: positive for contacts/glasses Hematologic/lymphatic: positive for bleeding and easy bruising Behavioral/Psych: positive for depression and sleep disturbance  Height 5' 2.5" (1.588 m), weight 200 lb (90.719 kg), last menstrual period 10/27/2006.  General appearance: alert, cooperative and appears stated age Head: Normocephalic, without obvious abnormality, atraumatic Neck: supple, symmetrical, trachea midline Resp: clear to auscultation bilaterally Cardio: regular rate and rhythm GI: non tender Extremities: intact sensation and capillary refill all digits.  +epl/fpl/io.  ttp distal radius.  no wounds.   Pulses: 2+ and symmetric Skin: Skin color, texture, turgor normal. No rashes or lesions Neurologic: Grossly normal Incision/Wound: na  Assessment/Plan Left distal radius fracture.  Non operative and operative treatment options were discussed with the patient and patient wishes to proceed with operative treatment. Risks, benefits, and alternatives of surgery were discussed and the patient agrees with the plan of care.   Cassandria Drew R 09/08/2012, 10:09 AM

## 2012-09-08 NOTE — Anesthesia Procedure Notes (Addendum)
Anesthesia Regional Block:  Supraclavicular block  Pre-Anesthetic Checklist: ,, timeout performed, Correct Patient, Correct Site, Correct Laterality, Correct Procedure, Correct Position, site marked, Risks and benefits discussed,  Surgical consent,  Pre-op evaluation,  At surgeon's request and post-op pain management  Laterality: Left  Prep: chloraprep       Needles:   Needle Type: Echogenic Stimulator Needle      Needle Gauge: 22 and 22 G    Additional Needles:  Procedures: ultrasound guided (picture in chart) and nerve stimulator Supraclavicular block  Nerve Stimulator or Paresthesia:  Response: forearm twitch, 0.8 mA,   Additional Responses:   Narrative:  Start time: 09/08/2012 1:19 PM End time: 09/08/2012 1:28 PM Injection made incrementally with aspirations every 5 mL.  Performed by: Personally  Anesthesiologist: Sandford Craze, MD  Additional Notes: Pt identified in Holding room.  Monitors applied. Working IV access confirmed. Sterile prep.  #22ga PNS to twitch witn US guidance.  30cc 0.5% Bupivacaine with 1:200k epi injected incrementally after negative test dose.  Patient asymptomatic, VSS, no heme aspirated, tolerated well.

## 2012-09-09 ENCOUNTER — Encounter (HOSPITAL_BASED_OUTPATIENT_CLINIC_OR_DEPARTMENT_OTHER): Payer: Self-pay | Admitting: Orthopedic Surgery

## 2012-09-09 ENCOUNTER — Ambulatory Visit (INDEPENDENT_AMBULATORY_CARE_PROVIDER_SITE_OTHER): Payer: Managed Care, Other (non HMO) | Admitting: Internal Medicine

## 2012-09-09 DIAGNOSIS — I4891 Unspecified atrial fibrillation: Secondary | ICD-10-CM

## 2012-09-09 DIAGNOSIS — Z7901 Long term (current) use of anticoagulants: Secondary | ICD-10-CM

## 2012-09-09 DIAGNOSIS — Z8679 Personal history of other diseases of the circulatory system: Secondary | ICD-10-CM

## 2012-09-09 NOTE — Op Note (Signed)
NAME:  Linda Ruiz, Linda Ruiz           ACCOUNT NO.:  [id number]  MEDICAL RECORD NO.:  [id number]  LOCATION:  WA07                         FACILITY:  [facility]  PHYSICIAN:  Roye Gustafson, MD        DATE OF BIRTH:  04/23/1950  DATE OF PROCEDURE:  09/08/2012 DATE OF DISCHARGE:  09/08/2012                              OPERATIVE REPORT   PREOPERATIVE DIAGNOSIS:  Left comminuted intra-articular distal radius fracture.  POSTOPERATIVE DIAGNOSIS:  Left comminuted intra-articular distal radius fracture.  PROCEDURE:  Open reduction and internal fixation, left comminuted intra- articular distal radius fracture.  SURGEON:  Erin Obando, MD  ASSISTANT:  Gary Kortland Nichols, MD.  ANESTHESIA:  General with regional.  IV FLUIDS:  Per anesthesia flow sheet.  ESTIMATED BLOOD LOSS:  Minimal.  COMPLICATIONS:  None.  SPECIMENS:  None.  TOURNIQUET TIME:  45 minutes.  DISPOSITION:  Stable to PACU.  INDICATIONS:  Linda Ruiz is a 62-year-old female over the weekend fell over her dogs in her home.  She injured her left wrist.  She was seen at the emergency department where radiographs were taken revealing a left distal radius fracture.  She was placed in a sugar-tong splint and followed up in the office.  On examination, she had intact sensation and capillary refill in the fingertips.  She could flex and extend the IP joint thumb across her fingers.  We discussed nonoperative and operative treatment options.  Risks, benefits, and alternatives of the surgery were reviewed and she wished to proceed with operative fixation. Risks, benefits, and alternatives of surgery were discussed including risk of blood loss, infection; damage to nerves, vessels, tendons, ligaments, bone; failure of surgery; need for additional surgery, complications with wound healing, continued pain, nonunion, malunion, stiffness.  She voiced understanding of these risks and elected to proceed.  OPERATIVE COURSE:  After being identified  preoperatively by myself, the patient and I agreed upon procedure and site procedure.  Surgical site was marked.  The risks, benefits, and alternatives of surgery were reviewed and she wished to proceed.  Surgical consent had been signed. She was given 2 g of IV Ancef as preoperative antibiotic prophylaxis. She was transferred to the operating room and placed on the operating table in supine position with the left upper extremity on arm board. General anesthesia was induced by anesthesiologist.  Regional block had been performed by anesthesia in preoperative holding.  The left upper extremity was then prepped and draped in normal sterile orthopedic fashion.  Surgical pause was performed between surgeons, anesthesia, operating staff, and all were in agreement as to the patient, procedure, and site of procedure.  Tourniquet at the proximal aspect of the extremity was inflated to 250 mmHg after exsanguination of the limb with an Esmarch bandage.  The standard volar Henry approach was used.  The skin was incised and the subcutaneous tissues entered by spreading technique.  Bipolar electrocautery was used to obtain hemostasis.  The superficial and deep portions of the FCR tendon sheath were incised and the FCR and FPL retracted ulnarly to protect the palmar cutaneous branch of the median nerve.  The brachioradialis was released.  The pronator quadratus was released from the radial side of the radius   and elevated with a periosteal elevator.  The fracture site was cleared of soft tissue interposition.  A standard volar distal radial locking plate from the Acumed set was selected and secured to the bone using the guide pins.  Ruiz-arm was used in AP and lateral projections to ensure proper duction, position of hardware which was the case.  A single screw was placed in the slotted hole in the shaft of the plate.  The distal screw holes were filled using standard AO drilling measuring  technique. Smooth locking pegs were used with the exception of the radial styloid holes which were filled with locking screws.  The remaining 2 holes in the shaft of the plate were filled with nonlocking screws.  The Ruiz-arm was used in AP and lateral projections to ensure appropriate reduction and position of the hardware which was the case.  Radial length and inclination and neutral volar tilt had been restored.  The wound was copiously irrigated with sterile saline.  The wrist was placed through pronation and supination.  Full motion had been obtained.  The pronator quadratus was repaired back over top of the plate using 4-0 Vicryl suture.  Two inverted interrupted Vicryl sutures were placed in the subcutaneous tissues.  The skin was closed with 4-0 nylon in a horizontal mattress fashion.  The wound was dressed with sterile Xeroform, 4x4s, and wrapped with a Kerlix bandage.  A volar splint was placed and wrapped with Kerlix and Ace bandage.  Tourniquet was deflated at 45 minutes.  Fingertips were pink with brisk capillary refill after deflation of tourniquet.  Operative drapes were broken down.  The patient was awoken from anesthesia safely.  She was transferred back to stretcher and taken to PACU in stable condition.  I will see her back in the office in 1 week for postoperative followup.  I will give her Percocet 5/325, 1-2 p.o. q.6 hours p.r.n. pain, dispensed #40.     Kjuan Seipp, MD     KK/MEDQ  D:  09/08/2012  T:  09/09/2012  Job:  [id number] 

## 2012-09-09 NOTE — Op Note (Deleted)
NAME:  ARIANA, JUUL NO.:  [id number]  MEDICAL RECORD NO.:  [id number]  LOCATION:  WA07                         FACILITY:  [facility]  PHYSICIAN:  Betha Loa, MD        DATE OF BIRTH:  11-May-1950  DATE OF PROCEDURE:  09/08/2012 DATE OF DISCHARGE:  09/08/2012                              OPERATIVE REPORT   PREOPERATIVE DIAGNOSIS:  Left comminuted intra-articular distal radius fracture.  POSTOPERATIVE DIAGNOSIS:  Left comminuted intra-articular distal radius fracture.  PROCEDURE:  Open reduction and internal fixation, left comminuted intra- articular distal radius fracture.  SURGEON:  Betha Loa, MD  ASSISTANT:  Cindee Salt, MD.  ANESTHESIA:  General with regional.  IV FLUIDS:  Per anesthesia flow sheet.  ESTIMATED BLOOD LOSS:  Minimal.  COMPLICATIONS:  None.  SPECIMENS:  None.  TOURNIQUET TIME:  45 minutes.  DISPOSITION:  Stable to PACU.  INDICATIONS:  Ms. Klee is a 62 year old female over the weekend fell over her dogs in her home.  She injured her left wrist.  She was seen at the emergency department where radiographs were taken revealing a left distal radius fracture.  She was placed in a sugar-tong splint and followed up in the office.  On examination, she had intact sensation and capillary refill in the fingertips.  She could flex and extend the IP joint thumb across her fingers.  We discussed nonoperative and operative treatment options.  Risks, benefits, and alternatives of the surgery were reviewed and she wished to proceed with operative fixation. Risks, benefits, and alternatives of surgery were discussed including risk of blood loss, infection; damage to nerves, vessels, tendons, ligaments, bone; failure of surgery; need for additional surgery, complications with wound healing, continued pain, nonunion, malunion, stiffness.  She voiced understanding of these risks and elected to proceed.  OPERATIVE COURSE:  After being identified  preoperatively by myself, the patient and I agreed upon procedure and site procedure.  Surgical site was marked.  The risks, benefits, and alternatives of surgery were reviewed and she wished to proceed.  Surgical consent had been signed. She was given 2 g of IV Ancef as preoperative antibiotic prophylaxis. She was transferred to the operating room and placed on the operating table in supine position with the left upper extremity on arm board. General anesthesia was induced by anesthesiologist.  Regional block had been performed by anesthesia in preoperative holding.  The left upper extremity was then prepped and draped in normal sterile orthopedic fashion.  Surgical pause was performed between surgeons, anesthesia, operating staff, and all were in agreement as to the patient, procedure, and site of procedure.  Tourniquet at the proximal aspect of the extremity was inflated to 250 mmHg after exsanguination of the limb with an Esmarch bandage.  The standard volar Sherilyn Cooter approach was used.  The skin was incised and the subcutaneous tissues entered by spreading technique.  Bipolar electrocautery was used to obtain hemostasis.  The superficial and deep portions of the FCR tendon sheath were incised and the FCR and FPL retracted ulnarly to protect the palmar cutaneous branch of the median nerve.  The brachioradialis was released.  The pronator quadratus was released from the radial side of the radius  and elevated with a periosteal elevator.  The fracture site was cleared of soft tissue interposition.  A standard volar distal radial locking plate from the Acumed set was selected and secured to the bone using the guide pins.  C-arm was used in AP and lateral projections to ensure proper duction, position of hardware which was the case.  A single screw was placed in the slotted hole in the shaft of the plate.  The distal screw holes were filled using standard AO drilling measuring  technique. Smooth locking pegs were used with the exception of the radial styloid holes which were filled with locking screws.  The remaining 2 holes in the shaft of the plate were filled with nonlocking screws.  The C-arm was used in AP and lateral projections to ensure appropriate reduction and position of the hardware which was the case.  Radial length and inclination and neutral volar tilt had been restored.  The wound was copiously irrigated with sterile saline.  The wrist was placed through pronation and supination.  Full motion had been obtained.  The pronator quadratus was repaired back over top of the plate using 4-0 Vicryl suture.  Two inverted interrupted Vicryl sutures were placed in the subcutaneous tissues.  The skin was closed with 4-0 nylon in a horizontal mattress fashion.  The wound was dressed with sterile Xeroform, 4x4s, and wrapped with a Kerlix bandage.  A volar splint was placed and wrapped with Kerlix and Ace bandage.  Tourniquet was deflated at 45 minutes.  Fingertips were pink with brisk capillary refill after deflation of tourniquet.  Operative drapes were broken down.  The patient was awoken from anesthesia safely.  She was transferred back to stretcher and taken to PACU in stable condition.  I will see her back in the office in 1 week for postoperative followup.  I will give her Percocet 5/325, 1-2 p.o. q.6 hours p.r.n. pain, dispensed #40.     Betha Loa, MD     KK/MEDQ  D:  09/08/2012  T:  09/09/2012  Job:  [id number]

## 2012-09-15 ENCOUNTER — Ambulatory Visit (INDEPENDENT_AMBULATORY_CARE_PROVIDER_SITE_OTHER): Payer: Managed Care, Other (non HMO) | Admitting: *Deleted

## 2012-09-15 DIAGNOSIS — Z7901 Long term (current) use of anticoagulants: Secondary | ICD-10-CM

## 2012-09-15 DIAGNOSIS — I4891 Unspecified atrial fibrillation: Secondary | ICD-10-CM

## 2012-09-15 DIAGNOSIS — Z8679 Personal history of other diseases of the circulatory system: Secondary | ICD-10-CM

## 2012-09-15 LAB — POCT INR: INR: 1.9

## 2012-09-29 ENCOUNTER — Ambulatory Visit (INDEPENDENT_AMBULATORY_CARE_PROVIDER_SITE_OTHER): Payer: Managed Care, Other (non HMO) | Admitting: *Deleted

## 2012-09-29 DIAGNOSIS — Z7901 Long term (current) use of anticoagulants: Secondary | ICD-10-CM

## 2012-09-29 DIAGNOSIS — Z8679 Personal history of other diseases of the circulatory system: Secondary | ICD-10-CM

## 2012-09-29 DIAGNOSIS — I4891 Unspecified atrial fibrillation: Secondary | ICD-10-CM

## 2012-09-29 LAB — POCT INR: INR: 2.6

## 2012-10-27 ENCOUNTER — Ambulatory Visit (INDEPENDENT_AMBULATORY_CARE_PROVIDER_SITE_OTHER): Payer: Managed Care, Other (non HMO) | Admitting: *Deleted

## 2012-10-27 DIAGNOSIS — I4891 Unspecified atrial fibrillation: Secondary | ICD-10-CM

## 2012-10-27 DIAGNOSIS — Z7901 Long term (current) use of anticoagulants: Secondary | ICD-10-CM

## 2012-10-27 DIAGNOSIS — Z8679 Personal history of other diseases of the circulatory system: Secondary | ICD-10-CM

## 2012-10-27 LAB — POCT INR: INR: 2.7

## 2012-11-17 ENCOUNTER — Ambulatory Visit (INDEPENDENT_AMBULATORY_CARE_PROVIDER_SITE_OTHER): Payer: Managed Care, Other (non HMO) | Admitting: Cardiovascular Disease

## 2012-11-17 ENCOUNTER — Ambulatory Visit (INDEPENDENT_AMBULATORY_CARE_PROVIDER_SITE_OTHER): Payer: Managed Care, Other (non HMO)

## 2012-11-17 ENCOUNTER — Encounter: Payer: Self-pay | Admitting: Cardiovascular Disease

## 2012-11-17 VITALS — BP 132/82 | HR 79 | Ht 62.0 in | Wt 201.8 lb

## 2012-11-17 DIAGNOSIS — I10 Essential (primary) hypertension: Secondary | ICD-10-CM

## 2012-11-17 DIAGNOSIS — S6292XA Unspecified fracture of left wrist and hand, initial encounter for closed fracture: Secondary | ICD-10-CM | POA: Insufficient documentation

## 2012-11-17 DIAGNOSIS — E782 Mixed hyperlipidemia: Secondary | ICD-10-CM

## 2012-11-17 DIAGNOSIS — Z7901 Long term (current) use of anticoagulants: Secondary | ICD-10-CM

## 2012-11-17 DIAGNOSIS — I4891 Unspecified atrial fibrillation: Secondary | ICD-10-CM

## 2012-11-17 DIAGNOSIS — S6292XS Unspecified fracture of left wrist and hand, sequela: Secondary | ICD-10-CM

## 2012-11-17 DIAGNOSIS — Z8679 Personal history of other diseases of the circulatory system: Secondary | ICD-10-CM

## 2012-11-17 DIAGNOSIS — S42309S Unspecified fracture of shaft of humerus, unspecified arm, sequela: Secondary | ICD-10-CM

## 2012-11-17 NOTE — Progress Notes (Signed)
Patient ID: Linda Ruiz, female   DOB: 1951/02/07, 62 y.o.   MRN: [id number] Linda Ruiz is seen today for F/U of her chronic afib, hypertension. She is asymptomatic with no palpitations, PNC, orthopnea, diaphoresis or syncope. She continues to work in [employer]	[place]. She had a spontaneous right rectus muscle hematoma on 10/24/11. Hct stable at 44. F/U CT 9/6 stable but still large hematoma. She was bending over a tub  Cleaning it and thinks she pressed too hard on the tub for a long period of time. Was off coumadin for a few weeks and hematoma resolve with no ill effects  Recent left hand fracture Rx by Kuzma  Tripped over dogs  ROS: Denies fever, malais, weight loss, blurry vision, decreased visual acuity, cough, sputum, SOB, hemoptysis, pleuritic pain, palpitaitons, heartburn, abdominal pain, melena, lower extremity edema, claudication, or rash.  All other systems reviewed and negative  General: Affect appropriate Healthy:  appears stated age HEENT: normal Neck supple with no adenopathy JVP normal no bruits no thyromegaly Lungs clear with no wheezing and good diaphragmatic motion Heart:  S1/S2 no murmur, no rub, gallop or click PMI normal Abdomen: benighn, BS positve, no tenderness, no AAA no bruit.  No HSM or HJR Distal pulses intact with no bruits No edema Neuro non-focal Skin warm and dry Left hand with volar scar and some paresthesias   Current Outpatient Prescriptions  Medication Sig Dispense Refill  . atorvastatin (LIPITOR) 20 MG tablet Take 20 mg by mouth every evening.       . metoprolol succinate (TOPROL-XL) 50 MG 24 hr tablet Take 50 mg by mouth every morning. Take with or immediately following a meal.      . oxyCODONE-acetaminophen (PERCOCET) 5-325 MG per tablet 1-2 tabs po q6 hours prn pain  40 tablet  0  . quinapril (ACCUPRIL) 20 MG tablet Take 20 mg by mouth at bedtime.        . sertraline (ZOLOFT) 50 MG tablet Take 50 mg by mouth at bedtime.       . verapamil  (CALAN-SR) 240 MG CR tablet Take 1 tablet (240 mg total) by mouth at bedtime.  30 tablet  9  . warfarin (COUMADIN) 5 MG tablet Take 2.5-7.5 mg by mouth every evening. Take 7.5mg  on Monday, take 2.5mg  on Friday, take 5mg  Tues, Wed, Thurs, Sat, Sun       No current facility-administered medications for this visit.    Allergies  Sulfonamide derivatives  Electrocardiogram:  afib rate 79  Low voltage nonspecific ST/T wave changes  Assessment and Plan

## 2012-11-17 NOTE — Assessment & Plan Note (Signed)
Cholesterol is at goal.  Continue current dose of statin and diet Rx.  No myalgias or side effects.  F/U  LFT's in 6 months. Lab Results  Component Value Date   LDLCALC 125* 04/06/2008             

## 2012-11-17 NOTE — Patient Instructions (Signed)
Your physician wants you to follow-up in:  6 MONTHS WITH DR NISHAN  You will receive a reminder letter in the mail two months in advance. If you don't receive a letter, please call our office to schedule the follow-up appointment. Your physician recommends that you continue on your current medications as directed. Please refer to the Current Medication list given to you today. 

## 2012-11-17 NOTE — Assessment & Plan Note (Signed)
Well controlled.  Continue current medications and low sodium Dash type diet.    

## 2012-11-17 NOTE — Assessment & Plan Note (Signed)
Good pulse.  Good strength  Median nerve paresthesia F/U Dr Merlyn Lot

## 2012-11-17 NOTE — Assessment & Plan Note (Signed)
Good rate control and anticoagulation  

## 2012-11-23 ENCOUNTER — Other Ambulatory Visit: Payer: Self-pay | Admitting: *Deleted

## 2012-11-23 MED ORDER — VERAPAMIL HCL ER 240 MG PO TBCR: 240.0000 mg | EXTENDED_RELEASE_TABLET | Freq: Every day | ORAL | Status: DC

## 2012-12-15 ENCOUNTER — Other Ambulatory Visit: Payer: Self-pay | Admitting: Pharmacist

## 2012-12-15 MED ORDER — WARFARIN SODIUM 5 MG PO TABS: ORAL_TABLET | ORAL | Status: DC

## 2012-12-16 ENCOUNTER — Other Ambulatory Visit: Payer: Self-pay

## 2012-12-16 MED ORDER — METOPROLOL SUCCINATE ER 50 MG PO TB24: 50.0000 mg | ORAL_TABLET | Freq: Every morning | ORAL | Status: DC

## 2012-12-28 ENCOUNTER — Other Ambulatory Visit: Payer: Self-pay | Admitting: Orthopedic Surgery

## 2012-12-29 ENCOUNTER — Ambulatory Visit (INDEPENDENT_AMBULATORY_CARE_PROVIDER_SITE_OTHER): Payer: Managed Care, Other (non HMO) | Admitting: *Deleted

## 2012-12-29 DIAGNOSIS — I4891 Unspecified atrial fibrillation: Secondary | ICD-10-CM

## 2012-12-29 DIAGNOSIS — Z8679 Personal history of other diseases of the circulatory system: Secondary | ICD-10-CM

## 2012-12-29 DIAGNOSIS — Z7901 Long term (current) use of anticoagulants: Secondary | ICD-10-CM

## 2012-12-29 LAB — POCT INR: INR: 1.9

## 2013-01-13 ENCOUNTER — Encounter (HOSPITAL_BASED_OUTPATIENT_CLINIC_OR_DEPARTMENT_OTHER): Payer: Self-pay | Admitting: *Deleted

## 2013-01-13 ENCOUNTER — Ambulatory Visit (HOSPITAL_BASED_OUTPATIENT_CLINIC_OR_DEPARTMENT_OTHER)
Admission: RE | Admit: 2013-01-13 | Discharge: 2013-01-13 | Disposition: A | Discharge status: Home / Self Care | Payer: Managed Care, Other (non HMO) | Source: Ambulatory Visit | Attending: Orthopedic Surgery | Admitting: Orthopedic Surgery

## 2013-01-13 LAB — BASIC METABOLIC PANEL
Calcium: 9.6 mg/dL (ref 8.4–10.5)
Creatinine, Ser: 0.88 mg/dL (ref 0.50–1.10)
GFR calc Af Amer: 81 mL/min — ABNORMAL LOW (ref >90)
GFR calc non Af Amer: 70 mL/min — ABNORMAL LOW (ref >90)
Glucose, Bld: 80 mg/dL (ref 70–99)

## 2013-01-13 LAB — PROTIME-INR
INR: 2.27 — ABNORMAL HIGH (ref 0.00–1.49)
Prothrombin Time: 24.3 seconds — ABNORMAL HIGH (ref 11.6–15.2)

## 2013-01-13 NOTE — Progress Notes (Signed)
Pt was here 7/14 with fx wrist-now has cts-dr Merlyn Lot does not want to stop coumadin but wanted to know inr-pt came in-went ahead and got all labs ekg was done 9/14 dr Melburn Popper

## 2013-01-19 ENCOUNTER — Ambulatory Visit (HOSPITAL_BASED_OUTPATIENT_CLINIC_OR_DEPARTMENT_OTHER): Payer: Managed Care, Other (non HMO) | Admitting: Anesthesiology

## 2013-01-19 ENCOUNTER — Encounter (HOSPITAL_BASED_OUTPATIENT_CLINIC_OR_DEPARTMENT_OTHER): Payer: Managed Care, Other (non HMO) | Admitting: Anesthesiology

## 2013-01-19 ENCOUNTER — Encounter (HOSPITAL_BASED_OUTPATIENT_CLINIC_OR_DEPARTMENT_OTHER): Payer: Self-pay | Admitting: Anesthesiology

## 2013-01-19 ENCOUNTER — Ambulatory Visit (HOSPITAL_BASED_OUTPATIENT_CLINIC_OR_DEPARTMENT_OTHER)
Admission: RE | Admit: 2013-01-19 | Discharge: 2013-01-19 | Disposition: A | Discharge status: Home / Self Care | Payer: Managed Care, Other (non HMO) | Source: Ambulatory Visit | Attending: Orthopedic Surgery | Admitting: Orthopedic Surgery

## 2013-01-19 ENCOUNTER — Encounter (HOSPITAL_BASED_OUTPATIENT_CLINIC_OR_DEPARTMENT_OTHER)
Admission: RE | Disposition: A | Discharge status: Home / Self Care | Payer: Self-pay | Source: Ambulatory Visit | Attending: Orthopedic Surgery

## 2013-01-19 DIAGNOSIS — Z79899 Other long term (current) drug therapy: Secondary | ICD-10-CM | POA: Insufficient documentation

## 2013-01-19 DIAGNOSIS — I4891 Unspecified atrial fibrillation: Secondary | ICD-10-CM | POA: Insufficient documentation

## 2013-01-19 DIAGNOSIS — G4730 Sleep apnea, unspecified: Secondary | ICD-10-CM | POA: Insufficient documentation

## 2013-01-19 DIAGNOSIS — Z7901 Long term (current) use of anticoagulants: Secondary | ICD-10-CM | POA: Insufficient documentation

## 2013-01-19 DIAGNOSIS — Z85828 Personal history of other malignant neoplasm of skin: Secondary | ICD-10-CM | POA: Insufficient documentation

## 2013-01-19 DIAGNOSIS — G5600 Carpal tunnel syndrome, unspecified upper limb: Secondary | ICD-10-CM | POA: Insufficient documentation

## 2013-01-19 DIAGNOSIS — E785 Hyperlipidemia, unspecified: Secondary | ICD-10-CM | POA: Insufficient documentation

## 2013-01-19 DIAGNOSIS — G43909 Migraine, unspecified, not intractable, without status migrainosus: Secondary | ICD-10-CM | POA: Insufficient documentation

## 2013-01-19 DIAGNOSIS — I10 Essential (primary) hypertension: Secondary | ICD-10-CM | POA: Insufficient documentation

## 2013-01-19 DIAGNOSIS — F411 Generalized anxiety disorder: Secondary | ICD-10-CM | POA: Insufficient documentation

## 2013-01-19 DIAGNOSIS — F17200 Nicotine dependence, unspecified, uncomplicated: Secondary | ICD-10-CM | POA: Insufficient documentation

## 2013-01-19 DIAGNOSIS — Z01812 Encounter for preprocedural laboratory examination: Secondary | ICD-10-CM | POA: Insufficient documentation

## 2013-01-19 HISTORY — PX: CARPAL TUNNEL RELEASE: SHX101

## 2013-01-19 LAB — PROTIME-INR: Prothrombin Time: 21 seconds — ABNORMAL HIGH (ref 11.6–15.2)

## 2013-01-19 SURGERY — CARPAL TUNNEL RELEASE
Anesthesia: General | Site: Hand | Laterality: Left | Wound class: Clean

## 2013-01-19 MED ORDER — MIDAZOLAM HCL 5 MG/5ML IJ SOLN
INTRAMUSCULAR | Status: DC | PRN
  Administered 2013-01-19: 2 mg via INTRAVENOUS

## 2013-01-19 MED ORDER — HYDROMORPHONE HCL PF 1 MG/ML IJ SOLN: 0.2500 mg | INTRAMUSCULAR | Status: DC | PRN

## 2013-01-19 MED ORDER — CEFAZOLIN SODIUM-DEXTROSE 2-3 GM-% IV SOLR
2.0000 g | INTRAVENOUS | Status: AC
  Administered 2013-01-19: 2 g via INTRAVENOUS

## 2013-01-19 MED ORDER — LACTATED RINGERS IV SOLN
INTRAVENOUS | Status: DC
  Administered 2013-01-19: 09:00:00 via INTRAVENOUS

## 2013-01-19 MED ORDER — OXYCODONE-ACETAMINOPHEN 5-325 MG PO TABS: ORAL_TABLET | ORAL | Status: DC

## 2013-01-19 MED ORDER — FENTANYL CITRATE 0.05 MG/ML IJ SOLN
INTRAMUSCULAR | Status: AC
  Filled 2013-01-19: qty 2

## 2013-01-19 MED ORDER — BUPIVACAINE HCL (PF) 0.25 % IJ SOLN
INTRAMUSCULAR | Status: AC
  Filled 2013-01-19: qty 30

## 2013-01-19 MED ORDER — PROPOFOL 10 MG/ML IV EMUL
INTRAVENOUS | Status: AC
  Filled 2013-01-19: qty 50

## 2013-01-19 MED ORDER — MIDAZOLAM HCL 2 MG/2ML IJ SOLN
INTRAMUSCULAR | Status: AC
  Filled 2013-01-19: qty 2

## 2013-01-19 MED ORDER — FENTANYL CITRATE 0.05 MG/ML IJ SOLN: 50.0000 ug | INTRAMUSCULAR | Status: DC | PRN

## 2013-01-19 MED ORDER — OXYCODONE HCL 5 MG PO TABS: 5.0000 mg | ORAL_TABLET | Freq: Once | ORAL | Status: DC | PRN

## 2013-01-19 MED ORDER — ONDANSETRON HCL 4 MG/2ML IJ SOLN
INTRAMUSCULAR | Status: DC | PRN
  Administered 2013-01-19: 4 mg via INTRAVENOUS

## 2013-01-19 MED ORDER — CHLORHEXIDINE GLUCONATE 4 % EX LIQD: 60.0000 mL | Freq: Once | CUTANEOUS | Status: DC

## 2013-01-19 MED ORDER — OXYCODONE HCL 5 MG/5ML PO SOLN: 5.0000 mg | Freq: Once | ORAL | Status: DC | PRN

## 2013-01-19 MED ORDER — MIDAZOLAM HCL 2 MG/2ML IJ SOLN: 1.0000 mg | INTRAMUSCULAR | Status: DC | PRN

## 2013-01-19 MED ORDER — FENTANYL CITRATE 0.05 MG/ML IJ SOLN
INTRAMUSCULAR | Status: DC | PRN
  Administered 2013-01-19 (×2): 50 ug via INTRAVENOUS

## 2013-01-19 MED ORDER — CEFAZOLIN SODIUM-DEXTROSE 2-3 GM-% IV SOLR
INTRAVENOUS | Status: AC
  Filled 2013-01-19: qty 50

## 2013-01-19 MED ORDER — BUPIVACAINE HCL (PF) 0.25 % IJ SOLN
INTRAMUSCULAR | Status: DC | PRN
  Administered 2013-01-19: 8 mL

## 2013-01-19 MED ORDER — PROPOFOL 10 MG/ML IV BOLUS
INTRAVENOUS | Status: DC | PRN
  Administered 2013-01-19: 150 mg via INTRAVENOUS

## 2013-01-19 SURGICAL SUPPLY — 38 items
BANDAGE ELASTIC 3 VELCRO ST LF (GAUZE/BANDAGES/DRESSINGS) ×2 IMPLANT
BANDAGE GAUZE ELAST BULKY 4 IN (GAUZE/BANDAGES/DRESSINGS) ×2 IMPLANT
BLADE MINI RND TIP GREEN BEAV (BLADE) IMPLANT
BLADE SURG 15 STRL LF DISP TIS (BLADE) ×2 IMPLANT
BLADE SURG 15 STRL SS (BLADE) ×4
BNDG CMPR 9X4 STRL LF SNTH (GAUZE/BANDAGES/DRESSINGS) ×1
BNDG ESMARK 4X9 LF (GAUZE/BANDAGES/DRESSINGS) ×1 IMPLANT
CHLORAPREP W/TINT 26ML (MISCELLANEOUS) ×2 IMPLANT
CORDS BIPOLAR (ELECTRODE) ×2 IMPLANT
COVER MAYO STAND STRL (DRAPES) ×2 IMPLANT
COVER TABLE BACK 60X90 (DRAPES) ×2 IMPLANT
CUFF TOURNIQUET SINGLE 18IN (TOURNIQUET CUFF) ×2 IMPLANT
DRAPE EXTREMITY T 121X128X90 (DRAPE) ×2 IMPLANT
DRAPE SURG 17X23 STRL (DRAPES) ×2 IMPLANT
DRSG PAD ABDOMINAL 8X10 ST (GAUZE/BANDAGES/DRESSINGS) ×2 IMPLANT
GAUZE XEROFORM 1X8 LF (GAUZE/BANDAGES/DRESSINGS) ×2 IMPLANT
GLOVE BIO SURGEON STRL SZ7.5 (GLOVE) ×2 IMPLANT
GLOVE BIOGEL PI IND STRL 8 (GLOVE) ×1 IMPLANT
GLOVE BIOGEL PI IND STRL 8.5 (GLOVE) IMPLANT
GLOVE BIOGEL PI INDICATOR 8 (GLOVE) ×1
GLOVE BIOGEL PI INDICATOR 8.5 (GLOVE) ×1
GLOVE ECLIPSE 6.5 STRL STRAW (GLOVE) ×1 IMPLANT
GLOVE SURG ORTHO 8.0 STRL STRW (GLOVE) ×1 IMPLANT
GOWN BRE IMP PREV XXLGXLNG (GOWN DISPOSABLE) ×3 IMPLANT
GOWN PREVENTION PLUS XLARGE (GOWN DISPOSABLE) ×2 IMPLANT
NDL HYPO 25X1 1.5 SAFETY (NEEDLE) IMPLANT
NEEDLE HYPO 25X1 1.5 SAFETY (NEEDLE) ×2 IMPLANT
NS IRRIG 1000ML POUR BTL (IV SOLUTION) ×2 IMPLANT
PACK BASIN DAY SURGERY FS (CUSTOM PROCEDURE TRAY) ×2 IMPLANT
PADDING CAST ABS 4INX4YD NS (CAST SUPPLIES) ×1
PADDING CAST ABS COTTON 4X4 ST (CAST SUPPLIES) ×1 IMPLANT
SPONGE GAUZE 4X4 12PLY (GAUZE/BANDAGES/DRESSINGS) ×2 IMPLANT
STOCKINETTE 4X48 STRL (DRAPES) ×2 IMPLANT
SUT ETHILON 4 0 PS 2 18 (SUTURE) ×2 IMPLANT
SYR BULB 3OZ (MISCELLANEOUS) ×2 IMPLANT
SYR CONTROL 10ML LL (SYRINGE) ×1 IMPLANT
TOWEL OR 17X24 6PK STRL BLUE (TOWEL DISPOSABLE) ×3 IMPLANT
UNDERPAD 30X30 INCONTINENT (UNDERPADS AND DIAPERS) ×2 IMPLANT

## 2013-01-19 NOTE — Anesthesia Procedure Notes (Signed)
Procedure Name: LMA Insertion Date/Time: 01/19/2013 9:22 AM Performed by: Caren Macadam Pre-anesthesia Checklist: Patient identified, Emergency Drugs available, Suction available and Patient being monitored Patient Re-evaluated:Patient Re-evaluated prior to inductionOxygen Delivery Method: Circle System Utilized Preoxygenation: Pre-oxygenation with 100% oxygen Intubation Type: IV induction Ventilation: Mask ventilation without difficulty LMA: LMA inserted LMA Size: 4.0 Number of attempts: 1 Airway Equipment and Method: bite block Placement Confirmation: positive ETCO2 and breath sounds checked- equal and bilateral Tube secured with: Tape Dental Injury: Teeth and Oropharynx as per pre-operative assessment

## 2013-01-19 NOTE — Anesthesia Preprocedure Evaluation (Signed)
Anesthesia Evaluation  Patient identified by MRN, date of birth, ID band Patient awake    Reviewed: Allergy & Precautions, H&P , NPO status , Patient's Chart, lab work & pertinent test results, reviewed documented beta blocker date and time   Airway Mallampati: III TM Distance: >3 FB Neck ROM: Full    Dental no notable dental hx. (+) Edentulous Upper, Upper Dentures and Dental Advisory Given   Pulmonary sleep apnea and Continuous Positive Airway Pressure Ventilation , Current Smoker,  breath sounds clear to auscultation  Pulmonary exam normal       Cardiovascular hypertension, negative cardio ROS  + dysrhythmias Atrial Fibrillation Rhythm:Irregular Rate:Normal     Neuro/Psych  Headaches, negative psych ROS   GI/Hepatic negative GI ROS, Neg liver ROS,   Endo/Other  Morbid obesity  Renal/GU negative Renal ROS  negative genitourinary   Musculoskeletal   Abdominal   Peds  Hematology negative hematology ROS (+)   Anesthesia Other Findings   Reproductive/Obstetrics negative OB ROS                           Anesthesia Physical Anesthesia Plan  ASA: III  Anesthesia Plan: General   Post-op Pain Management:    Induction: Intravenous  Airway Management Planned: LMA  Additional Equipment:   Intra-op Plan:   Post-operative Plan: Extubation in OR  Informed Consent: I have reviewed the patients History and Physical, chart, labs and discussed the procedure including the risks, benefits and alternatives for the proposed anesthesia with the patient or authorized representative who has indicated his/her understanding and acceptance.   Dental advisory given  Plan Discussed with: CRNA  Anesthesia Plan Comments:         Anesthesia Quick Evaluation

## 2013-01-19 NOTE — Op Note (Deleted)
NAME:  Linda Ruiz, YEAKEL NO.:  [id number]  MEDICAL RECORD NO.:  [id number]  LOCATION:                                 FACILITY:  PHYSICIAN:  Betha Loa, MD        DATE OF BIRTH:  March 08, 1950  DATE OF PROCEDURE:  01/19/2013 DATE OF DISCHARGE:  01/19/2013                              OPERATIVE REPORT   PREOPERATIVE DIAGNOSIS:  Left carpal tunnel syndrome.  POSTOPERATIVE DIAGNOSIS:  Left carpal tunnel syndrome.  PROCEDURE:  Left carpal tunnel release.  SURGEON:  Betha Loa, MD  ASSISTANT:  Cindee Salt, MD.  ANESTHESIA:  General.  IV FLUIDS:  Per Anesthesia flow sheet.  ESTIMATED BLOOD LOSS:  Minimal.  COMPLICATIONS:  None.  SPECIMENS:  None.  TOURNIQUET TIME:  11 minute.  DISPOSITION:  Stable, to PACU.  INDICATIONS:  [name] is a 62 year old female who underwent open reduction and internal fixation of left distal radius fracture approximately 4.5 months ago.  She did well postoperatively, but has was noted pins and needle sensation in the index, long, and ring fingers. Nerve conduction studies are positive.  She has positive Tinel's on the medial end of the carpal tunnel.  She wished to have a left carpal tunnel release for management of symptoms.  Risks, benefits, and alternatives of surgery were discussed including risk of blood loss, infection, damage to nerves, vessels, tendons, ligaments, bone; failure of surgery; need for additional surgery, complications with wound healing, continued pain, continued carpal tunnel syndrome.  She voiced understanding of these risks and elected to proceed.  OPERATIVE COURSE:  After being identified preoperatively by myself, the patient and I agreed upon procedure and site of procedure.  Surgical site was marked.  The risks, benefits, and alternatives of surgery were reviewed.  She wished to proceed.  Surgical consent had been signed. She was given IV Ancef as preoperative antibiotic prophylaxis.  She  has transferred to the operating room and placed on the operating room table in supine position with the left upper extremity on arm board.  General anesthesia was induced by the anesthesiologist.  The left upper extremity was prepped and draped in normal sterile orthopedic fashion. Surgical pause was performed between surgeons, anesthesia, operating staff, and all were in agreement as to the patient, procedure, and site of procedure.  Tourniquet at the proximal aspect of the extremity was inflated to 250 mmHg after exsanguination limb with Esmarch bandage. Incision was made over the transverse carpal ligament.  This carried into subcutaneous tissues by spreading technique.  Bipolar electrocautery was used to obtain hemostasis.  The palmar fascia was sharply incised.  The transverse carpal ligament was identified.  It was incised distally.  Care was taken to ensure complete decompression distally.  It was incised proximally.  Scissors used to split the distal aspect of the volar antebrachial fascia.  Finger was placed into the wound to ensure complete decompression which was the case.  The nerve was inspected.  It was adherent to the radial leaflet of the transverse carpal ligament.  The motor branch was identified and was intact.  The wound was copiously irrigated with sterile saline.  It was closed with 4- 0 nylon  a horizontal mattress fashion.  It was then injected with 8 mL of 0.25% plain Marcaine to aid in postoperative analgesia.  It was dressed with sterile Xeroform, 4x4s, an ABD and wrapped with Kerlix and Ace bandage.  Tourniquet was deflated at 11 minute.  Fingertips were pink with brisk capillary refill after deflation of tourniquet.  The operative drapes were broken down.  The patient was awoken from anesthesia safely.  She was transferred back to stretcher and taken to PACU in stable condition.  I will see her back in the office in 1 week for postoperative followup.  I will  give her Percocet 5/325 one-two p.o. q.6 h. p.r.n. pain, dispensed #40.     Betha Loa, MD     KK/MEDQ  D:  01/19/2013  T:  01/19/2013  Job:  [id number]

## 2013-01-19 NOTE — Anesthesia Postprocedure Evaluation (Signed)
  Anesthesia Post-op Note  Patient: Linda Ruiz  Procedure(s) Performed: Procedure(s): LEFT CARPAL TUNNEL RELEASE (Left)  Patient Location: PACU  Anesthesia Type:General  Level of Consciousness: awake and alert   Airway and Oxygen Therapy: Patient Spontanous Breathing  Post-op Pain: none  Post-op Assessment: Post-op Vital signs reviewed, Patient's Cardiovascular Status Stable and Respiratory Function Stable  Post-op Vital Signs: Reviewed  Filed Vitals:   01/19/13 1000  BP:   Pulse: 92  Temp:   Resp: 19    Complications: No apparent anesthesia complications

## 2013-01-19 NOTE — Brief Op Note (Signed)
01/19/2013  9:43 AM  PATIENT:  [name]  62 y.o. female  PRE-OPERATIVE DIAGNOSIS:  LEFT CARPAL TUNNEL SYNDROME  POST-OPERATIVE DIAGNOSIS:  LEFT CARPAL TUNNEL SYNDROME  PROCEDURE:  Procedure(s): LEFT CARPAL TUNNEL RELEASE (Left)  SURGEON:  Surgeon(s) and Role:    * Tami Ribas, MD - Primary    * Nicki Reaper, MD - Assisting  PHYSICIAN ASSISTANT:   ASSISTANTS: Cindee Salt, MD   ANESTHESIA:   general  EBL:  Total I/O In: 800 [I.V.:800] Out: -   BLOOD ADMINISTERED:none  DRAINS: none   LOCAL MEDICATIONS USED:  MARCAINE     SPECIMEN:  No Specimen  DISPOSITION OF SPECIMEN:  N/A  COUNTS:  YES  TOURNIQUET:   Total Tourniquet Time Documented: Upper Arm (Left) - 12 minutes Total: Upper Arm (Left) - 12 minutes   DICTATION: .Other Dictation: Dictation Number [id number]  PLAN OF CARE: Discharge to home after PACU  PATIENT DISPOSITION:  PACU - hemodynamically stable.

## 2013-01-19 NOTE — Op Note (Signed)
NAME:  Linda Ruiz, Linda Ruiz             ACCOUNT NO.:  [id number]  MEDICAL RECORD NO.:  [id number]  LOCATION:                                 FACILITY:  PHYSICIAN:  Maleiyah Releford, MD        DATE OF BIRTH:  05/28/1950  DATE OF PROCEDURE:  01/19/2013 DATE OF DISCHARGE:  01/19/2013                              OPERATIVE REPORT   PREOPERATIVE DIAGNOSIS:  Left carpal tunnel syndrome.  POSTOPERATIVE DIAGNOSIS:  Left carpal tunnel syndrome.  PROCEDURE:  Left carpal tunnel release.  SURGEON:  Prabhnoor Ellenberger, MD  ASSISTANT:  Gary Zamani Crocker, MD.  ANESTHESIA:  General.  IV FLUIDS:  Per Anesthesia flow sheet.  ESTIMATED BLOOD LOSS:  Minimal.  COMPLICATIONS:  None.  SPECIMENS:  None.  TOURNIQUET TIME:  11 minute.  DISPOSITION:  Stable, to PACU.  INDICATIONS:  Linda Ruiz is a 62-year-old female who underwent open reduction and internal fixation of left distal radius fracture approximately 4.5 months ago.  She did well postoperatively, but has was noted pins and needle sensation in the index, long, and ring fingers. Nerve conduction studies are positive.  She has positive Tinel's on the medial end of the carpal tunnel.  She wished to have a left carpal tunnel release for management of symptoms.  Risks, benefits, and alternatives of surgery were discussed including risk of blood loss, infection, damage to nerves, vessels, tendons, ligaments, bone; failure of surgery; need for additional surgery, complications with wound healing, continued pain, continued carpal tunnel syndrome.  She voiced understanding of these risks and elected to proceed.  OPERATIVE COURSE:  After being identified preoperatively by myself, the patient and I agreed upon procedure and site of procedure.  Surgical site was marked.  The risks, benefits, and alternatives of surgery were reviewed.  She wished to proceed.  Surgical consent had been signed. She was given IV Ancef as preoperative antibiotic prophylaxis.  She  has transferred to the operating room and placed on the operating room table in supine position with the left upper extremity on arm board.  General anesthesia was induced by the anesthesiologist.  The left upper extremity was prepped and draped in normal sterile orthopedic fashion. Surgical pause was performed between surgeons, anesthesia, operating staff, and all were in agreement as to the patient, procedure, and site of procedure.  Tourniquet at the proximal aspect of the extremity was inflated to 250 mmHg after exsanguination limb with Esmarch bandage. Incision was made over the transverse carpal ligament.  This carried into subcutaneous tissues by spreading technique.  Bipolar electrocautery was used to obtain hemostasis.  The palmar fascia was sharply incised.  The transverse carpal ligament was identified.  It was incised distally.  Care was taken to ensure complete decompression distally.  It was incised proximally.  Scissors used to split the distal aspect of the volar antebrachial fascia.  Finger was placed into the wound to ensure complete decompression which was the case.  The nerve was inspected.  It was adherent to the radial leaflet of the transverse carpal ligament.  The motor branch was identified and was intact.  The wound was copiously irrigated with sterile saline.  It was closed with 4- 0 nylon   a horizontal mattress fashion.  It was then injected with 8 mL of 0.25% plain Marcaine to aid in postoperative analgesia.  It was dressed with sterile Xeroform, 4x4s, an ABD and wrapped with Kerlix and Ace bandage.  Tourniquet was deflated at 11 minute.  Fingertips were pink with brisk capillary refill after deflation of tourniquet.  The operative drapes were broken down.  The patient was awoken from anesthesia safely.  She was transferred back to stretcher and taken to PACU in stable condition.  I will see her back in the office in 1 week for postoperative followup.  I will  give her Percocet 5/325 one-two p.o. q.6 h. p.r.n. pain, dispensed #40.     Dewaine Morocho, MD     KK/MEDQ  D:  01/19/2013  T:  01/19/2013  Job:  [id number] 

## 2013-01-19 NOTE — H&P (Signed)
Linda Ruiz is an 62 y.o. female.   Chief Complaint: left carpal tunnel syndrome HPI: 62 yo female with pins and needles sensation in left hand s/p ORIF left distal radius 4 months ago.  Positive nerve conduction studies.  She wishes to have a carpal tunnel release for management of symptoms.  Past Medical History  Diagnosis Date  . Atrial fibrillation   . Hypertension   . Hyperlipidemia   . Anxiety   . Skin cancer   . Migraine   . 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 Wears dentures     top  . Wears glasses   . Sleep apnea     uses cpap    Past Surgical History  Procedure Laterality Date  . Cesarean section    . Tubaligation    . Polypectomy      endometrial polyp removed 2005 D&C  . Foot surgery  05/2010    Left  . Skin cancer removal      this past year and five years ago  . Open reduction internal fixation (orif) distal radial fracture Left 09/08/2012    Procedure: OPEN REDUCTION INTERNAL FIXATION (ORIF) DISTAL RADIAL FRACTURE;  Surgeon: Tami Ribas, MD;  Location: [facility];  Service: Orthopedics;  Laterality: Left;    Family History  Problem Relation Age of Onset  . Colon cancer Neg Hx   . Esophageal cancer Neg Hx   . Rectal cancer Neg Hx   . Stomach cancer Neg Hx   . Cancer Mother     lung  . Cancer Father     lung  . Cancer Paternal Grandmother     breast   Social History:  reports that she has been smoking Cigarettes.  She has a 4 pack-year smoking history. She has never used smokeless tobacco. She reports that she does not drink alcohol or use illicit drugs.  Allergies:  Allergies  Allergen Reactions  . Sulfonamide Derivatives Rash    Medications Prior to Admission  Medication Sig Dispense Refill  . atorvastatin (LIPITOR) 20 MG tablet Take 20 mg by mouth every evening.       . metoprolol succinate (TOPROL-XL) 50 MG 24 hr tablet Take 50 mg by mouth every evening. Take with or immediately  following a meal.      . quinapril (ACCUPRIL) 20 MG tablet Take 20 mg by mouth at bedtime.        . sertraline (ZOLOFT) 50 MG tablet Take 50 mg by mouth at bedtime.       . verapamil (CALAN-SR) 240 MG CR tablet Take 1 tablet (240 mg total) by mouth at bedtime.  30 tablet  5  . warfarin (COUMADIN) 5 MG tablet Take as directed by Anticoagulation clinic  40 tablet  3    Results for orders placed during the hospital encounter of 01/19/13 (from the past 48 hour(s))  POCT HEMOGLOBIN-HEMACUE     Status: Abnormal   Collection Time    01/19/13  8:23 AM      Result Value Range   Hemoglobin 16.0 (*) 12.0 - 15.0 g/dL    No results found.   A comprehensive review of systems was negative except for: Eyes: positive for contacts/glasses Hematologic/lymphatic: positive for bleeding and easy bruising Behavioral/Psych: positive for depression and sleep disturbance  Blood pressure 144/92, pulse 96, temperature 98.2 F (36.8 C), temperature source Oral, resp. rate 20, height 5\' 2"  (1.575 m), weight 202  lb 12.8 oz (91.989 kg), last menstrual period 10/27/2006, SpO2 96.00%.  General appearance: alert, cooperative and appears stated age Head: Normocephalic, without obvious abnormality, atraumatic Neck: supple, symmetrical, trachea midline Resp: clear to auscultation bilaterally Cardio: regular rate and rhythm GI: non tender Extremities: decreased sensation i/l/r fingers.  positive tinels, phalens, durkins.  skin intact Pulses: 2+ and symmetric Skin: Skin color, texture, turgor normal. No rashes or lesions Neurologic: Grossly normal Incision/Wound: none  Assessment/Plan Left carpal tunnel syndrome.  Non operative and operative treatment options were discussed with the patient and patient wishes to proceed with operative treatment. Risks, benefits, and alternatives of surgery were discussed and the patient agrees with the plan of care.   Grayland Daisey R 01/19/2013, 8:26 AM

## 2013-01-19 NOTE — Transfer of Care (Signed)
Immediate Anesthesia Transfer of Care Note  Patient: Linda Ruiz  Procedure(s) Performed: Procedure(s): LEFT CARPAL TUNNEL RELEASE (Left)  Patient Location: PACU  Anesthesia Type:General  Level of Consciousness: awake and alert   Airway & Oxygen Therapy: Patient Spontanous Breathing and Patient connected to face mask oxygen  Post-op Assessment: Report given to PACU RN and Post -op Vital signs reviewed and stable  Post vital signs: Reviewed and stable  Complications: No apparent anesthesia complications

## 2013-01-19 NOTE — Op Note (Signed)
[id number] 

## 2013-01-20 ENCOUNTER — Encounter (HOSPITAL_BASED_OUTPATIENT_CLINIC_OR_DEPARTMENT_OTHER): Payer: Self-pay | Admitting: Orthopedic Surgery

## 2013-01-26 ENCOUNTER — Ambulatory Visit (INDEPENDENT_AMBULATORY_CARE_PROVIDER_SITE_OTHER): Payer: Managed Care, Other (non HMO) | Admitting: General Practice

## 2013-01-26 DIAGNOSIS — I4891 Unspecified atrial fibrillation: Secondary | ICD-10-CM

## 2013-01-26 DIAGNOSIS — Z8679 Personal history of other diseases of the circulatory system: Secondary | ICD-10-CM

## 2013-01-26 DIAGNOSIS — Z7901 Long term (current) use of anticoagulants: Secondary | ICD-10-CM

## 2013-03-09 ENCOUNTER — Ambulatory Visit (INDEPENDENT_AMBULATORY_CARE_PROVIDER_SITE_OTHER): Payer: Managed Care, Other (non HMO)

## 2013-03-09 DIAGNOSIS — Z7901 Long term (current) use of anticoagulants: Secondary | ICD-10-CM

## 2013-03-09 DIAGNOSIS — Z8679 Personal history of other diseases of the circulatory system: Secondary | ICD-10-CM

## 2013-03-09 DIAGNOSIS — I4891 Unspecified atrial fibrillation: Secondary | ICD-10-CM

## 2013-03-09 LAB — POCT INR: INR: 2.8

## 2013-04-20 ENCOUNTER — Ambulatory Visit (INDEPENDENT_AMBULATORY_CARE_PROVIDER_SITE_OTHER): Payer: Managed Care, Other (non HMO) | Admitting: *Deleted

## 2013-04-20 DIAGNOSIS — Z7901 Long term (current) use of anticoagulants: Secondary | ICD-10-CM

## 2013-04-20 DIAGNOSIS — I4891 Unspecified atrial fibrillation: Secondary | ICD-10-CM

## 2013-04-20 DIAGNOSIS — Z8679 Personal history of other diseases of the circulatory system: Secondary | ICD-10-CM

## 2013-04-20 DIAGNOSIS — Z5181 Encounter for therapeutic drug level monitoring: Secondary | ICD-10-CM | POA: Insufficient documentation

## 2013-04-20 LAB — POCT INR: INR: 2.5

## 2013-05-19 ENCOUNTER — Ambulatory Visit (INDEPENDENT_AMBULATORY_CARE_PROVIDER_SITE_OTHER): Payer: Managed Care, Other (non HMO) | Admitting: Cardiovascular Disease

## 2013-05-19 ENCOUNTER — Encounter: Payer: Self-pay | Admitting: Cardiovascular Disease

## 2013-05-19 VITALS — BP 134/82 | HR 74 | Ht 62.0 in | Wt 205.0 lb

## 2013-05-19 DIAGNOSIS — E782 Mixed hyperlipidemia: Secondary | ICD-10-CM

## 2013-05-19 DIAGNOSIS — I10 Essential (primary) hypertension: Secondary | ICD-10-CM

## 2013-05-19 DIAGNOSIS — I4891 Unspecified atrial fibrillation: Secondary | ICD-10-CM

## 2013-05-19 NOTE — Patient Instructions (Signed)
Your physician wants you to follow-up in:  6 MONTHS WITH DR NISHAN  You will receive a reminder letter in the mail two months in advance. If you don't receive a letter, please call our office to schedule the follow-up appointment. Your physician recommends that you continue on your current medications as directed. Please refer to the Current Medication list given to you today. 

## 2013-05-19 NOTE — Progress Notes (Signed)
Patient ID: Linda Ruiz, female   DOB: 1951-01-18, 63 y.o.   MRN: [id number] Ivoree is seen today for F/U of her chronic afib, hypertension. She is asymptomatic with no palpitations, PNC, orthopnea, diaphoresis or syncope. She continues to work in [employer]. She had a spontaneous right rectus muscle hematoma on 10/24/11. Hct stable at 44. F/U CT 9/6 stable but still large hematoma. She was bending over a tub  Cleaning it and thinks she pressed too hard on the tub for a long period of time. Was off coumadin for a few weeks and hematoma resolve with no ill effects Recent left hand fracture   Rx by Kuzma Tripped over dogs      ROS: Denies fever, malais, weight loss, blurry vision, decreased visual acuity, cough, sputum, SOB, hemoptysis, pleuritic pain, palpitaitons, heartburn, abdominal pain, melena, lower extremity edema, claudication, or rash.  All other systems reviewed and negative  General: Affect appropriate Healthy:  appears stated age 14: normal Neck supple with no adenopathy JVP normal no bruits no thyromegaly Lungs clear with no wheezing and good diaphragmatic motion Heart:  S1/S2 no murmur, no rub, gallop or click PMI normal Abdomen: benighn, BS positve, no tenderness, no AAA no bruit.  No HSM or HJR Distal pulses intact with no bruits No edema Neuro non-focal Skin warm and dry No muscular weakness   Current Outpatient Prescriptions  Medication Sig Dispense Refill  . atorvastatin (LIPITOR) 20 MG tablet Take 20 mg by mouth every evening.       . metoprolol succinate (TOPROL-XL) 50 MG 24 hr tablet Take 50 mg by mouth every evening. Take with or immediately following a meal.      . quinapril (ACCUPRIL) 20 MG tablet Take 20 mg by mouth at bedtime.        . sertraline (ZOLOFT) 50 MG tablet Take 50 mg by mouth at bedtime.       . verapamil (CALAN-SR) 240 MG CR tablet Take 1 tablet (240 mg total) by mouth at bedtime.  30 tablet  5  . warfarin (COUMADIN) 5 MG  tablet Take as directed by Anticoagulation clinic  40 tablet  3   No current facility-administered medications for this visit.    Allergies  Sulfonamide derivatives  Electrocardiogram:  afib rate 79  Nonspecific ST/T wave changes   Assessment and Plan

## 2013-05-19 NOTE — Assessment & Plan Note (Signed)
Well controlled.  Continue current medications and low sodium Dash type diet.    

## 2013-05-19 NOTE — Assessment & Plan Note (Signed)
Good rate control and anticoagulation no further bleeding

## 2013-05-19 NOTE — Assessment & Plan Note (Signed)
Cholesterol is at goal.  Continue current dose of statin and diet Rx.  No myalgias or side effects.  F/U  LFT's in 6 months. Lab Results  Component Value Date   LDLCALC 125* 04/06/2008

## 2013-05-26 ENCOUNTER — Other Ambulatory Visit: Payer: Self-pay

## 2013-05-26 MED ORDER — VERAPAMIL HCL ER 240 MG PO TBCR: 240.0000 mg | EXTENDED_RELEASE_TABLET | Freq: Every day | ORAL | Status: DC

## 2013-06-01 ENCOUNTER — Ambulatory Visit (INDEPENDENT_AMBULATORY_CARE_PROVIDER_SITE_OTHER): Payer: Managed Care, Other (non HMO) | Admitting: *Deleted

## 2013-06-01 DIAGNOSIS — I4891 Unspecified atrial fibrillation: Secondary | ICD-10-CM

## 2013-06-01 DIAGNOSIS — Z5181 Encounter for therapeutic drug level monitoring: Secondary | ICD-10-CM

## 2013-06-01 DIAGNOSIS — Z8679 Personal history of other diseases of the circulatory system: Secondary | ICD-10-CM

## 2013-06-01 DIAGNOSIS — Z7901 Long term (current) use of anticoagulants: Secondary | ICD-10-CM

## 2013-06-01 LAB — POCT INR: INR: 1.5

## 2013-06-03 ENCOUNTER — Encounter: Payer: Self-pay | Admitting: Certified Nurse Midwife

## 2013-06-03 ENCOUNTER — Other Ambulatory Visit: Payer: Self-pay | Admitting: *Deleted

## 2013-06-03 ENCOUNTER — Ambulatory Visit (INDEPENDENT_AMBULATORY_CARE_PROVIDER_SITE_OTHER): Payer: Commercial Indemnity | Admitting: Certified Nurse Midwife

## 2013-06-03 VITALS — BP 108/78 | HR 80 | Resp 18 | Ht 62.5 in | Wt 204.0 lb

## 2013-06-03 DIAGNOSIS — Z01419 Encounter for gynecological examination (general) (routine) without abnormal findings: Secondary | ICD-10-CM

## 2013-06-03 MED ORDER — WARFARIN SODIUM 5 MG PO TABS: ORAL_TABLET | ORAL | Status: DC

## 2013-06-03 NOTE — Patient Instructions (Signed)

## 2013-06-03 NOTE — Progress Notes (Signed)
63 y.o. G50P1001 Married Caucasian Fe here for annual exam.  Denies vaginal bleeding or vaginal dryness. Has not noted any unusual area on vulva since last reoccurence of VIN in 2013. Sister passed in 12/14. Emotionnally OK. Sees PCP for hypertnesion/depression management. Sees cardiology for atrial fib. No medication changes in past year. All labs done with MD's. No health issues today.  Patient's last menstrual period was 10/27/2006.          Sexually active: yes  The current method of family planning is tubal ligation.    Exercising: yes  Walking Smoker:  yes  Health Maintenance: Pap:  05/2012 Neg. HR HPV neg MMG:  05/2010 BI-RADS 1 : Neg Colonoscopy:  06/2011 - Polyps. - every 5 years BMD:   2009 TDaP:2005 will do at PCP Labs: PCP   reports that she has been smoking Cigarettes.  She has a 4 pack-year smoking history. She has never used smokeless tobacco. She reports that she drinks alcohol. She reports that she does not use illicit drugs.  Past Medical History  Diagnosis Date  . Atrial fibrillation   . Hypertension   . Hyperlipidemia   . Anxiety   . Skin cancer   . Migraine   . 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 Wears dentures     top  . Wears glasses   . Sleep apnea     uses cpap  . Fracture of wrist, closed 08/2012    Left    Past Surgical History  Procedure Laterality Date  . Cesarean section    . Tubaligation    . Polypectomy      endometrial polyp removed 2005 D&C  . Foot surgery  05/2010    Left  . Skin cancer removal      this past year and five years ago  . Open reduction internal fixation (orif) distal radial fracture Left 09/08/2012    Procedure: OPEN REDUCTION INTERNAL FIXATION (ORIF) DISTAL RADIAL FRACTURE;  Surgeon: Tennis Must, MD;  Location: [facility];  Service: Orthopedics;  Laterality: Left;  . Carpal tunnel release Left 01/19/2013    Procedure: LEFT CARPAL TUNNEL RELEASE;  Surgeon: Tennis Must, MD;  Location: [facility];  Service: Orthopedics;  Laterality: Left;    Current Outpatient Prescriptions  Medication Sig Dispense Refill  . atorvastatin (LIPITOR) 20 MG tablet Take 20 mg by mouth every evening.       . metoprolol succinate (TOPROL-XL) 50 MG 24 hr tablet Take 50 mg by mouth every evening. Take with or immediately following a meal.      . quinapril (ACCUPRIL) 20 MG tablet Take 20 mg by mouth at bedtime.        . sertraline (ZOLOFT) 50 MG tablet Take 50 mg by mouth at bedtime.       . verapamil (CALAN-SR) 240 MG CR tablet Take 1 tablet (240 mg total) by mouth at bedtime.  30 tablet  5  . warfarin (COUMADIN) 5 MG tablet Take as directed by Anticoagulation clinic  40 tablet  3   No current facility-administered medications for this visit.    Family History  Problem Relation Age of Onset  . Colon cancer Neg Hx   . Esophageal cancer Neg Hx   . Rectal cancer Neg Hx   . Stomach cancer Neg Hx   . Cancer Mother     lung  . Cancer Father  lung  . Cancer Paternal Grandmother     breast    ROS:  Pertinent items are noted in HPI.  Otherwise, a comprehensive ROS was negative.  Exam:   BP 108/78  Pulse 80  Resp 18  Ht 5' 2.5" (1.588 m)  Wt 204 lb (92.534 kg)  BMI 36.69 kg/m2  LMP 10/27/2006 Height: 5' 2.5" (158.8 cm)  Ht Readings from Last 3 Encounters:  06/03/13 5' 2.5" (1.588 m)  05/19/13 5\' 2"  (1.575 m)  01/19/13 5\' 2"  (1.575 m)    General appearance: alert, cooperative and appears stated age Head: Normocephalic, without obvious abnormality, atraumatic Neck: no adenopathy, supple, symmetrical, trachea midline and thyroid normal to inspection and palpation and non-palpable Lungs: clear to auscultation bilaterally Breasts: normal appearance, no masses or tenderness, No nipple retraction or dimpling, No nipple discharge or bleeding, No axillary or supraclavicular adenopathy Heart: regular rate and rhythm Abdomen: soft, non-tender; no masses,   no organomegaly Extremities: extremities normal, atraumatic, no cyanosis or edema Skin: Skin color, texture, turgor normal. No rashes or lesions Lymph nodes: Cervical, supraclavicular, and axillary nodes normal. No abnormal inguinal nodes palpated Neurologic: Grossly normal   Pelvic: External genitalia:  no lesions, no skin changes noted on vulva bilateral              Urethra:  normal appearing urethra with no masses, tenderness or lesions              Bartholin's and Skene's: normal                 Vagina: normal appearing vagina with normal color and discharge, no lesions              Cervix: normal, non tender              Pap taken: no Bimanual Exam:  Uterus:  normal size, contour, position, consistency, mobility, non-tender and mid position              Adnexa: normal adnexa and no mass, fullness, tenderness               Rectovaginal: Confirms               Anus:  normal sphincter tone, no lesions  A:  Well Woman with normal exam  Menopausal no HRT  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 No changes noted in vulva area on exam  Hypertension/depression with PCP management  Atrial Fib with cardiology management  P:   Reviewed health and wellness pertinent to exam  Advise if vaginal bleeding  Continue to self monitor,and notes in changes or feels different to come in.  Continue follow up as indicated.  Pap smear as per guidelines   Mammogram stressed, given info to schedule pap smear not taken today  counseled on breast self exam, mammography screening, adequate intake of calcium and vitamin D, diet and exercise  return annually or prn  An After Visit Summary was printed and given to the patient.

## 2013-06-04 NOTE — Progress Notes (Signed)
Reviewed personally.  M. Suzanne Duayne Brideau, MD.  

## 2013-06-09 ENCOUNTER — Ambulatory Visit (INDEPENDENT_AMBULATORY_CARE_PROVIDER_SITE_OTHER): Payer: Managed Care, Other (non HMO) | Admitting: *Deleted

## 2013-06-09 DIAGNOSIS — I4891 Unspecified atrial fibrillation: Secondary | ICD-10-CM

## 2013-06-09 DIAGNOSIS — Z5181 Encounter for therapeutic drug level monitoring: Secondary | ICD-10-CM

## 2013-06-09 DIAGNOSIS — Z8679 Personal history of other diseases of the circulatory system: Secondary | ICD-10-CM

## 2013-06-09 DIAGNOSIS — Z7901 Long term (current) use of anticoagulants: Secondary | ICD-10-CM

## 2013-06-09 LAB — POCT INR: INR: 3.9

## 2013-06-21 ENCOUNTER — Ambulatory Visit (INDEPENDENT_AMBULATORY_CARE_PROVIDER_SITE_OTHER): Payer: Managed Care, Other (non HMO) | Admitting: Pharmacist

## 2013-06-21 DIAGNOSIS — Z7901 Long term (current) use of anticoagulants: Secondary | ICD-10-CM

## 2013-06-21 DIAGNOSIS — Z5181 Encounter for therapeutic drug level monitoring: Secondary | ICD-10-CM

## 2013-06-21 DIAGNOSIS — Z8679 Personal history of other diseases of the circulatory system: Secondary | ICD-10-CM

## 2013-06-21 DIAGNOSIS — I4891 Unspecified atrial fibrillation: Secondary | ICD-10-CM

## 2013-06-21 LAB — POCT INR: INR: 1.8

## 2013-06-28 ENCOUNTER — Ambulatory Visit (INDEPENDENT_AMBULATORY_CARE_PROVIDER_SITE_OTHER): Payer: Managed Care, Other (non HMO) | Admitting: *Deleted

## 2013-06-28 DIAGNOSIS — Z5181 Encounter for therapeutic drug level monitoring: Secondary | ICD-10-CM

## 2013-06-28 DIAGNOSIS — Z8679 Personal history of other diseases of the circulatory system: Secondary | ICD-10-CM

## 2013-06-28 DIAGNOSIS — Z7901 Long term (current) use of anticoagulants: Secondary | ICD-10-CM

## 2013-06-28 DIAGNOSIS — I4891 Unspecified atrial fibrillation: Secondary | ICD-10-CM

## 2013-06-28 LAB — POCT INR: INR: 2.4

## 2013-07-20 ENCOUNTER — Ambulatory Visit (INDEPENDENT_AMBULATORY_CARE_PROVIDER_SITE_OTHER): Payer: Managed Care, Other (non HMO) | Admitting: *Deleted

## 2013-07-20 DIAGNOSIS — Z5181 Encounter for therapeutic drug level monitoring: Secondary | ICD-10-CM

## 2013-07-20 DIAGNOSIS — Z7901 Long term (current) use of anticoagulants: Secondary | ICD-10-CM

## 2013-07-20 DIAGNOSIS — Z8679 Personal history of other diseases of the circulatory system: Secondary | ICD-10-CM

## 2013-07-20 DIAGNOSIS — I4891 Unspecified atrial fibrillation: Secondary | ICD-10-CM

## 2013-07-20 LAB — POCT INR: INR: 2.2

## 2013-07-22 ENCOUNTER — Other Ambulatory Visit: Payer: Self-pay

## 2013-07-22 MED ORDER — METOPROLOL SUCCINATE ER 50 MG PO TB24: 50.0000 mg | ORAL_TABLET | Freq: Every evening | ORAL | Status: DC

## 2013-08-17 ENCOUNTER — Ambulatory Visit (INDEPENDENT_AMBULATORY_CARE_PROVIDER_SITE_OTHER): Payer: Managed Care, Other (non HMO) | Admitting: *Deleted

## 2013-08-17 DIAGNOSIS — Z7901 Long term (current) use of anticoagulants: Secondary | ICD-10-CM

## 2013-08-17 DIAGNOSIS — Z5181 Encounter for therapeutic drug level monitoring: Secondary | ICD-10-CM

## 2013-08-17 DIAGNOSIS — Z8679 Personal history of other diseases of the circulatory system: Secondary | ICD-10-CM

## 2013-08-17 DIAGNOSIS — I4891 Unspecified atrial fibrillation: Secondary | ICD-10-CM

## 2013-08-17 LAB — POCT INR: INR: 2.4

## 2013-09-14 ENCOUNTER — Ambulatory Visit (INDEPENDENT_AMBULATORY_CARE_PROVIDER_SITE_OTHER): Payer: Managed Care, Other (non HMO)

## 2013-09-14 DIAGNOSIS — Z5181 Encounter for therapeutic drug level monitoring: Secondary | ICD-10-CM

## 2013-09-14 DIAGNOSIS — Z7901 Long term (current) use of anticoagulants: Secondary | ICD-10-CM

## 2013-09-14 DIAGNOSIS — I4891 Unspecified atrial fibrillation: Secondary | ICD-10-CM

## 2013-09-14 DIAGNOSIS — Z8679 Personal history of other diseases of the circulatory system: Secondary | ICD-10-CM

## 2013-09-14 LAB — POCT INR: INR: 2.6

## 2013-10-26 ENCOUNTER — Ambulatory Visit (INDEPENDENT_AMBULATORY_CARE_PROVIDER_SITE_OTHER): Payer: Managed Care, Other (non HMO) | Admitting: Pharmacist Clinician (PhC)/ Clinical Pharmacy Specialist

## 2013-10-26 DIAGNOSIS — Z5181 Encounter for therapeutic drug level monitoring: Secondary | ICD-10-CM

## 2013-10-26 DIAGNOSIS — Z8679 Personal history of other diseases of the circulatory system: Secondary | ICD-10-CM

## 2013-10-26 DIAGNOSIS — I4891 Unspecified atrial fibrillation: Secondary | ICD-10-CM

## 2013-10-26 DIAGNOSIS — Z7901 Long term (current) use of anticoagulants: Secondary | ICD-10-CM

## 2013-10-26 LAB — POCT INR: INR: 2.8

## 2013-10-28 ENCOUNTER — Other Ambulatory Visit: Payer: Self-pay | Admitting: *Deleted

## 2013-10-28 MED ORDER — WARFARIN SODIUM 5 MG PO TABS: ORAL_TABLET | ORAL | Status: DC

## 2013-11-30 ENCOUNTER — Ambulatory Visit (INDEPENDENT_AMBULATORY_CARE_PROVIDER_SITE_OTHER): Payer: Managed Care, Other (non HMO) | Admitting: *Deleted

## 2013-11-30 ENCOUNTER — Encounter: Payer: Self-pay | Admitting: Cardiovascular Disease

## 2013-11-30 ENCOUNTER — Ambulatory Visit (INDEPENDENT_AMBULATORY_CARE_PROVIDER_SITE_OTHER): Payer: Managed Care, Other (non HMO) | Admitting: Cardiovascular Disease

## 2013-11-30 VITALS — BP 135/78 | HR 91 | Ht 61.0 in | Wt 203.0 lb

## 2013-11-30 DIAGNOSIS — Z7901 Long term (current) use of anticoagulants: Secondary | ICD-10-CM

## 2013-11-30 DIAGNOSIS — E782 Mixed hyperlipidemia: Secondary | ICD-10-CM

## 2013-11-30 DIAGNOSIS — Z8679 Personal history of other diseases of the circulatory system: Secondary | ICD-10-CM

## 2013-11-30 DIAGNOSIS — I4891 Unspecified atrial fibrillation: Secondary | ICD-10-CM

## 2013-11-30 DIAGNOSIS — Z5181 Encounter for therapeutic drug level monitoring: Secondary | ICD-10-CM

## 2013-11-30 DIAGNOSIS — I10 Essential (primary) hypertension: Secondary | ICD-10-CM

## 2013-11-30 LAB — POCT INR: INR: 2.1

## 2013-11-30 NOTE — Assessment & Plan Note (Signed)
Well controlled.  Continue current medications and low sodium Dash type diet.    

## 2013-11-30 NOTE — Assessment & Plan Note (Signed)
Cholesterol is at goal.  Continue current dose of statin and diet Rx.  No myalgias or side effects.  F/U  LFT's in 6 months. Lab Results  Component Value Date   LDLCALC 125* 04/06/2008  Labs with primary Dr Reynaldo Minium

## 2013-11-30 NOTE — Progress Notes (Signed)
Patient ID: Linda Ruiz, female   DOB: 23-Jun-1950, 63 y.o.   MRN: [id number] Linda Ruiz is seen today for F/U of her chronic afib, hypertension. She is asymptomatic with no palpitations, PNC, orthopnea, diaphoresis or syncope. She continues to work in [employer]. She had a spontaneous right rectus muscle hematoma on 10/24/11. Hct stable at 44. F/U CT 9/6 stable but still large hematoma. She was bending over a tub  Cleaning it and thinks she pressed too hard on the tub for a long period of time. Was off coumadin for a few weeks and hematoma resolve with no ill effects    INR 9/1 2.8  Sees Reynaldo Minium as her primary Has been sick with flu shot in past and she does not one    ROS: Denies fever, malais, weight loss, blurry vision, decreased visual acuity, cough, sputum, SOB, hemoptysis, pleuritic pain, palpitaitons, heartburn, abdominal pain, melena, lower extremity edema, claudication, or rash.  All other systems reviewed and negative  General: Affect appropriate Healthy:  appears stated age 63: normal Neck supple with no adenopathy JVP normal no bruits no thyromegaly Lungs clear with no wheezing and good diaphragmatic motion Heart:  S1/S2 no murmur, no rub, gallop or click PMI normal Abdomen: benighn, BS positve, no tenderness, no AAA no bruit.  No HSM or HJR Distal pulses intact with no bruits No edema Neuro non-focal Skin warm and dry No muscular weakness   Current Outpatient Prescriptions  Medication Sig Dispense Refill  . atorvastatin (LIPITOR) 20 MG tablet Take 20 mg by mouth every evening.       . metoprolol succinate (TOPROL-XL) 50 MG 24 hr tablet Take 1 tablet (50 mg total) by mouth every evening. Take with or immediately following a meal.  30 tablet  6  . quinapril (ACCUPRIL) 20 MG tablet Take 20 mg by mouth at bedtime.        . sertraline (ZOLOFT) 50 MG tablet Take 50 mg by mouth at bedtime.       . verapamil (CALAN-SR) 240 MG CR tablet Take 1 tablet (240 mg total)  by mouth at bedtime.  30 tablet  5  . warfarin (COUMADIN) 5 MG tablet 1 tablet daily except 1/2 tablet on Fridays or as directed by Anticoagulation clinic  35 tablet  3   No current facility-administered medications for this visit.    Allergies  Sulfonamide derivatives  Electrocardiogram:  Afib rate 91  Low voltage   Assessment and Plan

## 2013-11-30 NOTE — Assessment & Plan Note (Addendum)
Good rate control and anticoagulation.  At some point when monoclonal antibody to NOAC readilly avialable will consider changing her over.  Since she has had spontaneous rectus sheath hematoma she is sensitive to wanting a anticoagulant that is readily reversable and her Coumadin has been relatively easy to control  Has not had evaluation of EF in years Given chronic afib will order echo

## 2013-11-30 NOTE — Patient Instructions (Signed)

## 2013-12-01 ENCOUNTER — Other Ambulatory Visit: Payer: Self-pay | Admitting: *Deleted

## 2013-12-01 MED ORDER — VERAPAMIL HCL ER 240 MG PO TBCR: 240.0000 mg | EXTENDED_RELEASE_TABLET | Freq: Every day | ORAL | Status: DC

## 2013-12-06 ENCOUNTER — Ambulatory Visit (HOSPITAL_COMMUNITY): Payer: Managed Care, Other (non HMO) | Attending: Cardiology

## 2013-12-06 DIAGNOSIS — I4891 Unspecified atrial fibrillation: Secondary | ICD-10-CM | POA: Insufficient documentation

## 2013-12-06 NOTE — Progress Notes (Signed)
2D Echo completed. 12/06/2013 

## 2013-12-10 ENCOUNTER — Telehealth: Payer: Self-pay | Admitting: Cardiovascular Disease

## 2013-12-10 NOTE — Telephone Encounter (Signed)
°  Patient is returning your call from Wednesday, Please call and advise.

## 2013-12-10 NOTE — Telephone Encounter (Signed)
PT AWARE OF ECHO RESULTS./CY 

## 2013-12-27 ENCOUNTER — Encounter: Payer: Self-pay | Admitting: Cardiovascular Disease

## 2014-01-11 ENCOUNTER — Ambulatory Visit (INDEPENDENT_AMBULATORY_CARE_PROVIDER_SITE_OTHER): Payer: Managed Care, Other (non HMO)

## 2014-01-11 DIAGNOSIS — Z5181 Encounter for therapeutic drug level monitoring: Secondary | ICD-10-CM

## 2014-01-11 DIAGNOSIS — I4891 Unspecified atrial fibrillation: Secondary | ICD-10-CM

## 2014-01-11 DIAGNOSIS — Z7901 Long term (current) use of anticoagulants: Secondary | ICD-10-CM

## 2014-01-11 DIAGNOSIS — Z8679 Personal history of other diseases of the circulatory system: Secondary | ICD-10-CM

## 2014-01-11 LAB — POCT INR: INR: 2.5

## 2014-02-22 ENCOUNTER — Ambulatory Visit (INDEPENDENT_AMBULATORY_CARE_PROVIDER_SITE_OTHER): Payer: Managed Care, Other (non HMO) | Admitting: Pharmacist

## 2014-02-22 DIAGNOSIS — Z7901 Long term (current) use of anticoagulants: Secondary | ICD-10-CM

## 2014-02-22 DIAGNOSIS — I4891 Unspecified atrial fibrillation: Secondary | ICD-10-CM

## 2014-02-22 DIAGNOSIS — Z5181 Encounter for therapeutic drug level monitoring: Secondary | ICD-10-CM

## 2014-02-22 DIAGNOSIS — Z8679 Personal history of other diseases of the circulatory system: Secondary | ICD-10-CM

## 2014-02-22 LAB — POCT INR: INR: 2.2

## 2014-02-22 MED ORDER — METOPROLOL SUCCINATE ER 50 MG PO TB24: 50.0000 mg | ORAL_TABLET | Freq: Every evening | ORAL | Status: DC

## 2014-02-23 ENCOUNTER — Other Ambulatory Visit: Payer: Self-pay | Admitting: *Deleted

## 2014-03-28 ENCOUNTER — Telehealth: Payer: Self-pay | Admitting: *Deleted

## 2014-03-28 NOTE — Telephone Encounter (Signed)
NEEDS  AN APPT  FOR  April    TO SEE  DR  Johnsie Cancel LM FOR  CALL BACK .Adonis Housekeeper

## 2014-04-04 ENCOUNTER — Telehealth: Payer: Self-pay

## 2014-04-04 MED ORDER — WARFARIN SODIUM 5 MG PO TABS: ORAL_TABLET | ORAL | Status: DC

## 2014-04-04 NOTE — Telephone Encounter (Signed)
Warfarin refilled to pharmacy as requested

## 2014-04-05 ENCOUNTER — Ambulatory Visit (INDEPENDENT_AMBULATORY_CARE_PROVIDER_SITE_OTHER): Payer: Managed Care, Other (non HMO) | Admitting: *Deleted

## 2014-04-05 DIAGNOSIS — I4891 Unspecified atrial fibrillation: Secondary | ICD-10-CM

## 2014-04-05 DIAGNOSIS — Z7901 Long term (current) use of anticoagulants: Secondary | ICD-10-CM

## 2014-04-05 DIAGNOSIS — Z8679 Personal history of other diseases of the circulatory system: Secondary | ICD-10-CM

## 2014-04-05 DIAGNOSIS — Z5181 Encounter for therapeutic drug level monitoring: Secondary | ICD-10-CM

## 2014-04-05 LAB — POCT INR: INR: 2.1

## 2014-05-17 ENCOUNTER — Ambulatory Visit (INDEPENDENT_AMBULATORY_CARE_PROVIDER_SITE_OTHER): Payer: Managed Care, Other (non HMO) | Admitting: Pharmacist Clinician (PhC)/ Clinical Pharmacy Specialist

## 2014-05-17 DIAGNOSIS — I4891 Unspecified atrial fibrillation: Secondary | ICD-10-CM | POA: Diagnosis not present

## 2014-05-17 DIAGNOSIS — Z8679 Personal history of other diseases of the circulatory system: Secondary | ICD-10-CM

## 2014-05-17 DIAGNOSIS — Z7901 Long term (current) use of anticoagulants: Secondary | ICD-10-CM | POA: Diagnosis not present

## 2014-05-17 DIAGNOSIS — Z5181 Encounter for therapeutic drug level monitoring: Secondary | ICD-10-CM | POA: Diagnosis not present

## 2014-05-17 LAB — POCT INR: INR: 2.8

## 2014-05-29 NOTE — Progress Notes (Signed)
Patient ID: Linda Ruiz, female   DOB: [birth year], 64 y.o.   MRN: [id number] Linda Ruiz is seen today for F/U of her chronic afib, hypertension. She is asymptomatic with no palpitations, PNC, orthopnea, diaphoresis or syncope. She continues to work in [employer]. She had a spontaneous right rectus muscle hematoma on 10/24/11.  Cleaning it and thinks she pressed too hard on the tub for a long period of time. Was off coumadin for a few weeks and hematoma resolve with no ill effects    Echo 12/06/13 Reviewed  Study Conclusions  - Left ventricle: The cavity size was normal. Wall thickness was normal. Systolic function was normal. The estimated ejection fraction was in the range of 55% to 60%. Wall motion was normal; there were no regional wall motion abnormalities. - Mitral valve: Calcified annulus. - Left atrium: The atrium was mildly dilated. - Pulmonary arteries: Systolic pressure was mildly increased. PA peak pressure: 41 mm Hg (S).  Impressions:  - Normal LV function; mild LAE; mildly elevated pulmonary pressure.  ROS: Denies fever, malais, weight loss, blurry vision, decreased visual acuity, cough, sputum, SOB, hemoptysis, pleuritic pain, palpitaitons, heartburn, abdominal pain, melena, lower extremity edema, claudication, or rash.  All other systems reviewed and negative  General: Affect appropriate Healthy:  appears stated age 64: normal Neck supple with no adenopathy JVP normal no bruits no thyromegaly Lungs clear with no wheezing and good diaphragmatic motion Heart:  S1/S2 no murmur, no rub, gallop or click PMI normal Abdomen: benighn, BS positve, no tenderness, no AAA no bruit.  No HSM or HJR Distal pulses intact with no bruits No edema Neuro non-focal Skin warm and dry No muscular weakness   Current Outpatient Prescriptions  Medication Sig Dispense Refill  . atorvastatin (LIPITOR) 20 MG tablet Take 20 mg by mouth every evening.     . metoprolol  succinate (TOPROL-XL) 50 MG 24 hr tablet Take 1 tablet (50 mg total) by mouth every evening. Take with or immediately following a meal. 30 tablet 11  . quinapril (ACCUPRIL) 20 MG tablet Take 20 mg by mouth at bedtime.      . sertraline (ZOLOFT) 50 MG tablet Take 50 mg by mouth at bedtime.     . verapamil (CALAN-SR) 240 MG CR tablet Take 1 tablet (240 mg total) by mouth at bedtime. 30 tablet 11  . warfarin (COUMADIN) 5 MG tablet Take as directed by Anticoagulation clinic 35 tablet 3   No current facility-administered medications for this visit.    Allergies  Sulfa antibiotics and Sulfonamide derivatives  Electrocardiogram:   11/30/13  Afib rate 91  Low voltage   Assessment and Plan

## 2014-05-30 ENCOUNTER — Encounter: Payer: Self-pay | Admitting: Cardiovascular Disease

## 2014-05-30 ENCOUNTER — Ambulatory Visit (INDEPENDENT_AMBULATORY_CARE_PROVIDER_SITE_OTHER): Payer: Managed Care, Other (non HMO) | Admitting: Cardiovascular Disease

## 2014-05-30 VITALS — BP 130/90 | HR 65 | Ht 61.0 in | Wt 203.8 lb

## 2014-05-30 DIAGNOSIS — Z79899 Other long term (current) drug therapy: Secondary | ICD-10-CM

## 2014-05-30 LAB — BASIC METABOLIC PANEL
BUN: 16 mg/dL (ref 6–23)
CHLORIDE: 105 meq/L (ref 96–112)
CO2: 27 meq/L (ref 19–32)
CREATININE: 1.06 mg/dL (ref 0.40–1.20)
Calcium: 9.3 mg/dL (ref 8.4–10.5)
GFR: 55.9 mL/min — AB (ref >60.00)
Glucose, Bld: 76 mg/dL (ref 70–99)
Potassium: 4 mEq/L (ref 3.5–5.1)
Sodium: 137 mEq/L (ref 135–145)

## 2014-05-30 LAB — CBC WITH DIFFERENTIAL/PLATELET
BASOS ABS: 0.1 10*3/uL (ref 0.0–0.1)
Basophils Relative: 0.6 % (ref 0.0–3.0)
Eosinophils Absolute: 0.1 10*3/uL (ref 0.0–0.7)
Eosinophils Relative: 1.1 % (ref 0.0–5.0)
HEMATOCRIT: 43 % (ref 36.0–46.0)
Hemoglobin: 14.8 g/dL (ref 12.0–15.0)
LYMPHS ABS: 2.4 10*3/uL (ref 0.7–4.0)
Lymphocytes Relative: 27.4 % (ref 12.0–46.0)
MCHC: 34.5 g/dL (ref 30.0–36.0)
MCV: 92.3 fl (ref 78.0–100.0)
Monocytes Absolute: 0.6 10*3/uL (ref 0.1–1.0)
Monocytes Relative: 7.4 % (ref 3.0–12.0)
Neutro Abs: 5.5 10*3/uL (ref 1.4–7.7)
Neutrophils Relative %: 63.5 % (ref 43.0–77.0)
PLATELETS: 207 10*3/uL (ref 150.0–400.0)
RBC: 4.66 Mil/uL (ref 3.87–5.11)
RDW: 13.6 % (ref 11.5–15.5)
WBC: 8.6 10*3/uL (ref 4.0–10.5)

## 2014-05-30 NOTE — Patient Instructions (Signed)
Your physician wants you to follow-up in: [place] will receive a reminder letter in the mail two months in advance. If you don't receive a letter, please call our office to schedule the follow-up appointment. Your physician recommends that you continue on your current medications as directed. Please refer to the Current Medication list given to you today.   Your physician recommends that you return for lab work in:  [place]

## 2014-06-08 ENCOUNTER — Ambulatory Visit (INDEPENDENT_AMBULATORY_CARE_PROVIDER_SITE_OTHER): Payer: Commercial Indemnity | Admitting: Certified Nurse Midwife

## 2014-06-08 ENCOUNTER — Encounter: Payer: Self-pay | Admitting: Certified Nurse Midwife

## 2014-06-08 VITALS — BP 120/80 | HR 72 | Resp 18 | Ht 62.25 in | Wt 204.0 lb

## 2014-06-08 DIAGNOSIS — Z23 Encounter for immunization: Secondary | ICD-10-CM | POA: Diagnosis not present

## 2014-06-08 DIAGNOSIS — Z01419 Encounter for gynecological examination (general) (routine) without abnormal findings: Secondary | ICD-10-CM | POA: Diagnosis not present

## 2014-06-08 DIAGNOSIS — Z124 Encounter for screening for malignant neoplasm of cervix: Secondary | ICD-10-CM | POA: Diagnosis not present

## 2014-06-08 NOTE — Progress Notes (Signed)
64 y.o. G27P1001 Married  Caucasian Fe here for annual exam. Menpausal no HRT.Denies vaginal bleeding or vaginal dryness. Sees PCP twice yearly, labs/aex and medication management. Cardiology follows cholesterol/CMP. Stable Coumadin level.  Still smoking but has not increased amount. Mammogram with mobile unit at work, no record on file, will request.. No health issues today.  Patient's last menstrual period was 10/27/2006.          Sexually active: No.  The current method of family planning is tubal ligation and post menopausal status.    Exercising: No.  The patient does not participate in regular exercise at present. Smoker:  yes  Health Maintenance: Pap: 05/28/12 Neg. HR HPV:Neg MMG: 2015 at work - Normal Self Breast Check: yes, occ Colonoscopy: 06/2011 Polyps, diverticulosis - repeat 3 years  BMD:  2009 TDaP: will get it here.  Labs: PCP UA:PCP   reports that she has been smoking Cigarettes.  She has a 4 pack-year smoking history. She has never used smokeless tobacco. She reports that she does not drink alcohol or use illicit drugs.  Past Medical History  Diagnosis Date  . Atrial fibrillation   . Hypertension   . Hyperlipidemia   . Anxiety   . Skin cancer   . Migraine   . 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 Wears dentures     top  . Wears glasses   . Sleep apnea     uses cpap  . Fracture of wrist, closed 08/2012    Left    Past Surgical History  Procedure Laterality Date  . Cesarean section    . Tubaligation    . Polypectomy      endometrial polyp removed 2005 D&C  . Foot surgery  05/2010    Left  . Skin cancer removal      this past year and five years ago  . Open reduction internal fixation (orif) distal radial fracture Left 09/08/2012    Procedure: OPEN REDUCTION INTERNAL FIXATION (ORIF) DISTAL RADIAL FRACTURE;  Surgeon: Tennis Must, MD;  Location: [facility];  Service: Orthopedics;  Laterality: Left;  . Carpal  tunnel release Left 01/19/2013    Procedure: LEFT CARPAL TUNNEL RELEASE;  Surgeon: Tennis Must, MD;  Location: [facility];  Service: Orthopedics;  Laterality: Left;    Current Outpatient Prescriptions  Medication Sig Dispense Refill  . atorvastatin (LIPITOR) 20 MG tablet Take 20 mg by mouth every evening.     . metoprolol succinate (TOPROL-XL) 50 MG 24 hr tablet Take 1 tablet (50 mg total) by mouth every evening. Take with or immediately following a meal. 30 tablet 11  . quinapril (ACCUPRIL) 20 MG tablet Take 20 mg by mouth at bedtime.      . sertraline (ZOLOFT) 50 MG tablet Take 50 mg by mouth at bedtime.     . verapamil (CALAN-SR) 240 MG CR tablet Take 1 tablet (240 mg total) by mouth at bedtime. 30 tablet 11  . warfarin (COUMADIN) 5 MG tablet Take as directed by Anticoagulation clinic 35 tablet 3   No current facility-administered medications for this visit.    Family History  Problem Relation Age of Onset  . Colon cancer Neg Hx   . Esophageal cancer Neg Hx   . Rectal cancer Neg Hx   . Stomach cancer Neg Hx   . Cancer Mother     lung  . Cancer Father     lung  .  Cancer Paternal Grandmother     breast  . COPD Sister   . Emphysema Sister     ROS:  Pertinent items are noted in HPI.  Otherwise, a comprehensive ROS was negative.  Exam:   LMP 10/27/2006   Ht Readings from Last 3 Encounters:  05/30/14 5\' 1"  (1.549 m)  11/30/13 5\' 1"  (1.549 m)  06/03/13 5' 2.5" (1.588 m)    General appearance: alert, cooperative and appears stated age Head: Normocephalic, without obvious abnormality, atraumatic Neck: no adenopathy, supple, symmetrical, trachea midline and thyroid normal to inspection and palpation Lungs: clear to auscultation bilaterally Breasts: normal appearance, no masses or tenderness, No nipple retraction or dimpling, No nipple discharge or bleeding, No axillary or supraclavicular adenopathy Heart: regular rate and rhythm Abdomen: soft, non-tender; no  masses,  no organomegaly Extremities: extremities normal, atraumatic, no cyanosis or edema Skin: Skin color, texture, turgor normal. No rashes or lesions Lymph nodes: Cervical, supraclavicular, and axillary nodes normal. No abnormal inguinal nodes palpated Neurologic: Grossly normal   Pelvic: External genitalia:  no lesions or skin changes normal female              Urethra:  normal appearing urethra with no masses, tenderness or lesions              Bartholin's and Skene's: normal                 Vagina: normal appearing vagina with normal color and discharge, no lesions              Cervix: normal appearance,no lesions              Pap taken: Yes.   Bimanual Exam:  Uterus:  normal size, contour, position, consistency, mobility, non-tender              Adnexa: normal adnexa and no mass, fullness, tenderness               Rectovaginal: Confirms               Anus:  normal sphincter tone, no lesions  Chaperone present: Yes  A:  Well Woman with normal exam  Menopausal no HRT  Immunization due  Cholesterol/hypertension with PCP management  Coumadin management with Cardiology  Obesity  History of [id number]  Smoker  P:   Reviewed health and wellness pertinent to exam  Aware of need to evaluate if vaginal bleeding  Requests TDAP  Continue follow up with MD as indicated  Pap smear taken today with HPV reflex  Not interested in cessation   counseled on breast self exam, mammography screening, adequate intake of calcium and vitamin D, diet and exercise and need to decrease weight to prevent other health issues.  return annually or prn  An After Visit Summary was printed and given to the patient.

## 2014-06-08 NOTE — Patient Instructions (Signed)
EXERCISE AND DIET:  We recommended that you start or continue a regular exercise program for good health. Regular exercise means any activity that makes your heart beat faster and makes you sweat.  We recommend exercising at least 30 minutes per day at least 3 days a week, preferably 4 or 5.  We also recommend a diet low in fat and sugar.  Inactivity, poor dietary choices and obesity can cause diabetes, heart attack, stroke, and kidney damage, among others.    ALCOHOL AND SMOKING:  Women should limit their alcohol intake to no more than 7 drinks/beers/glasses of wine (combined, not each!) per week. Moderation of alcohol intake to this level decreases your risk of breast cancer and liver damage. And of course, no recreational drugs are part of a healthy lifestyle.  And absolutely no smoking or even second hand smoke. Most people know smoking can cause heart and lung diseases, but did you know it also contributes to weakening of your bones? Aging of your skin?  Yellowing of your teeth and nails?  CALCIUM AND VITAMIN D:  Adequate intake of calcium and Vitamin D are recommended.  The recommendations for exact amounts of these supplements seem to change often, but generally speaking 600 mg of calcium (either carbonate or citrate) and 800 units of Vitamin D per day seems prudent. Certain women may benefit from higher intake of Vitamin D.  If you are among these women, your doctor will have told you during your visit.    PAP SMEARS:  Pap smears, to check for cervical cancer or precancers,  have traditionally been done yearly, although recent scientific advances have shown that most women can have pap smears less often.  However, every woman still should have a physical exam from her gynecologist every year. It will include a breast check, inspection of the vulva and vagina to check for abnormal growths or skin changes, a visual exam of the cervix, and then an exam to evaluate the size and shape of the uterus and  ovaries.  And after 62 years of age, a rectal exam is indicated to check for rectal cancers. We will also provide age appropriate advice regarding health maintenance, like when you should have certain vaccines, screening for sexually transmitted diseases, bone density testing, colonoscopy, mammograms, etc.   MAMMOGRAMS:  All women over 40 years old should have a yearly mammogram. Many facilities now offer a "3D" mammogram, which may cost around $50 extra out of pocket. If possible,  we recommend you accept the option to have the 3D mammogram performed.  It both reduces the number of women who will be called back for extra views which then turn out to be normal, and it is better than the routine mammogram at detecting truly abnormal areas.    COLONOSCOPY:  Colonoscopy to screen for colon cancer is recommended for all women at age 50.  We know, you hate the idea of the prep.  We agree, BUT, having colon cancer and not knowing it is worse!!  Colon cancer so often starts as a polyp that can be seen and removed at colonscopy, which can quite literally save your life!  And if your first colonoscopy is normal and you have no family history of colon cancer, most women don't have to have it again for 10 years.  Once every ten years, you can do something that may end up saving your life, right?  We will be happy to help you get it scheduled when you are ready.    Be sure to check your insurance coverage so you understand how much it will cost.  It may be covered as a preventative service at no cost, but you should check your particular policy.     Colonoscopy A colonoscopy is an exam to look at the entire large intestine (colon). This exam can help find problems such as tumors, polyps, inflammation, and areas of bleeding. The exam takes about 1 hour.  LET [facility] CARE PROVIDER KNOW ABOUT:   Any allergies you have.  All medicines you are taking, including vitamins, herbs, eye drops, creams, and over-the-counter  medicines.  Previous problems you or members of your family have had with the use of anesthetics.  Any blood disorders you have.  Previous surgeries you have had.  Medical conditions you have. RISKS AND COMPLICATIONS  Generally, this is a safe procedure. However, as with any procedure, complications can occur. Possible complications include:  Bleeding.  Tearing or rupture of the colon wall.  Reaction to medicines given during the exam.  Infection (rare). BEFORE THE PROCEDURE   Ask your health care provider about changing or stopping your regular medicines.  You may be prescribed an oral bowel prep. This involves drinking a large amount of medicated liquid, starting the day before your procedure. The liquid will cause you to have multiple loose stools until your stool is almost clear or light green. This cleans out your colon in preparation for the procedure.  Do not eat or drink anything else once you have started the bowel prep, unless your health care provider tells you it is safe to do so.  Arrange for someone to drive you home after the procedure. PROCEDURE   You will be given medicine to help you relax (sedative).  You will lie on your side with your knees bent.  A long, flexible tube with a light and camera on the end (colonoscope) will be inserted through the rectum and into the colon. The camera sends video back to a computer screen as it moves through the colon. The colonoscope also releases carbon dioxide gas to inflate the colon. This helps your health care provider see the area better.  During the exam, your health care provider may take a small tissue sample (biopsy) to be examined under a microscope if any abnormalities are found.  The exam is finished when the entire colon has been viewed. AFTER THE PROCEDURE   Do not drive for 24 hours after the exam.  You may have a small amount of blood in your stool.  You may pass moderate amounts of gas and have mild  abdominal cramping or bloating. This is caused by the gas used to inflate your colon during the exam.  Ask when your test results will be ready and how you will get your results. Make sure you get your test results. Document Released: 02/09/2000 Document Revised: 12/02/2012 Document Reviewed: 10/19/2012 [facility] Patient Information 2015 [place], [place]. This information is not intended to replace advice given to you by your health care provider. Make sure you discuss any questions you have with your health care provider.

## 2014-06-09 LAB — IPS PAP TEST WITH REFLEX TO HPV

## 2014-06-12 NOTE — Progress Notes (Signed)
Reviewed personally.  M. Suzanne Maury Bamba, MD.  

## 2014-06-28 ENCOUNTER — Ambulatory Visit (INDEPENDENT_AMBULATORY_CARE_PROVIDER_SITE_OTHER): Payer: Managed Care, Other (non HMO) | Admitting: *Deleted

## 2014-06-28 DIAGNOSIS — Z8679 Personal history of other diseases of the circulatory system: Secondary | ICD-10-CM | POA: Diagnosis not present

## 2014-06-28 DIAGNOSIS — Z5181 Encounter for therapeutic drug level monitoring: Secondary | ICD-10-CM

## 2014-06-28 DIAGNOSIS — I4891 Unspecified atrial fibrillation: Secondary | ICD-10-CM | POA: Diagnosis not present

## 2014-06-28 DIAGNOSIS — Z7901 Long term (current) use of anticoagulants: Secondary | ICD-10-CM

## 2014-06-28 LAB — POCT INR: INR: 1.8

## 2014-07-15 ENCOUNTER — Encounter: Payer: Self-pay | Admitting: Gastroenterology

## 2014-07-26 ENCOUNTER — Ambulatory Visit (INDEPENDENT_AMBULATORY_CARE_PROVIDER_SITE_OTHER): Payer: Managed Care, Other (non HMO) | Admitting: *Deleted

## 2014-07-26 DIAGNOSIS — I4891 Unspecified atrial fibrillation: Secondary | ICD-10-CM

## 2014-07-26 DIAGNOSIS — Z5181 Encounter for therapeutic drug level monitoring: Secondary | ICD-10-CM | POA: Diagnosis not present

## 2014-07-26 DIAGNOSIS — Z7901 Long term (current) use of anticoagulants: Secondary | ICD-10-CM | POA: Diagnosis not present

## 2014-07-26 DIAGNOSIS — Z8679 Personal history of other diseases of the circulatory system: Secondary | ICD-10-CM | POA: Diagnosis not present

## 2014-07-26 LAB — POCT INR: INR: 3.1

## 2014-08-16 ENCOUNTER — Ambulatory Visit (INDEPENDENT_AMBULATORY_CARE_PROVIDER_SITE_OTHER): Payer: Managed Care, Other (non HMO)

## 2014-08-16 DIAGNOSIS — Z7901 Long term (current) use of anticoagulants: Secondary | ICD-10-CM

## 2014-08-16 DIAGNOSIS — Z5181 Encounter for therapeutic drug level monitoring: Secondary | ICD-10-CM

## 2014-08-16 DIAGNOSIS — Z8679 Personal history of other diseases of the circulatory system: Secondary | ICD-10-CM

## 2014-08-16 DIAGNOSIS — I4891 Unspecified atrial fibrillation: Secondary | ICD-10-CM | POA: Diagnosis not present

## 2014-08-16 LAB — POCT INR: INR: 2.2

## 2014-08-30 ENCOUNTER — Other Ambulatory Visit: Payer: Self-pay | Admitting: *Deleted

## 2014-08-30 MED ORDER — WARFARIN SODIUM 5 MG PO TABS: ORAL_TABLET | ORAL | Status: DC

## 2014-09-13 ENCOUNTER — Ambulatory Visit (INDEPENDENT_AMBULATORY_CARE_PROVIDER_SITE_OTHER): Payer: Managed Care, Other (non HMO) | Admitting: *Deleted

## 2014-09-13 DIAGNOSIS — Z8679 Personal history of other diseases of the circulatory system: Secondary | ICD-10-CM | POA: Diagnosis not present

## 2014-09-13 DIAGNOSIS — Z5181 Encounter for therapeutic drug level monitoring: Secondary | ICD-10-CM | POA: Diagnosis not present

## 2014-09-13 DIAGNOSIS — I4891 Unspecified atrial fibrillation: Secondary | ICD-10-CM | POA: Diagnosis not present

## 2014-09-13 DIAGNOSIS — Z7901 Long term (current) use of anticoagulants: Secondary | ICD-10-CM

## 2014-09-13 LAB — POCT INR: INR: 2.5

## 2014-10-11 ENCOUNTER — Ambulatory Visit (INDEPENDENT_AMBULATORY_CARE_PROVIDER_SITE_OTHER): Payer: Managed Care, Other (non HMO)

## 2014-10-11 DIAGNOSIS — Z5181 Encounter for therapeutic drug level monitoring: Secondary | ICD-10-CM

## 2014-10-11 DIAGNOSIS — Z7901 Long term (current) use of anticoagulants: Secondary | ICD-10-CM

## 2014-10-11 DIAGNOSIS — Z8679 Personal history of other diseases of the circulatory system: Secondary | ICD-10-CM

## 2014-10-11 DIAGNOSIS — I4891 Unspecified atrial fibrillation: Secondary | ICD-10-CM | POA: Diagnosis not present

## 2014-10-11 LAB — POCT INR: INR: 2.9

## 2014-11-22 ENCOUNTER — Ambulatory Visit (INDEPENDENT_AMBULATORY_CARE_PROVIDER_SITE_OTHER): Payer: Managed Care, Other (non HMO) | Admitting: *Deleted

## 2014-11-22 DIAGNOSIS — I4891 Unspecified atrial fibrillation: Secondary | ICD-10-CM | POA: Diagnosis not present

## 2014-11-22 DIAGNOSIS — Z7901 Long term (current) use of anticoagulants: Secondary | ICD-10-CM

## 2014-11-22 DIAGNOSIS — Z5181 Encounter for therapeutic drug level monitoring: Secondary | ICD-10-CM | POA: Diagnosis not present

## 2014-11-22 DIAGNOSIS — Z8679 Personal history of other diseases of the circulatory system: Secondary | ICD-10-CM | POA: Diagnosis not present

## 2014-11-22 LAB — POCT INR: INR: 3

## 2014-11-28 ENCOUNTER — Other Ambulatory Visit: Payer: Self-pay

## 2014-11-28 MED ORDER — VERAPAMIL HCL ER 240 MG PO TBCR: 240.0000 mg | EXTENDED_RELEASE_TABLET | Freq: Every day | ORAL | Status: DC

## 2014-11-28 NOTE — Telephone Encounter (Signed)
Josue Hector, MD at 05/29/2014 8:51 PM  verapamil (CALAN-SR) 240 MG CR tabletTake 1 tablet (240 mg total) by mouth at bedtime Your physician recommends that you continue on your current medications as directed. Please refer to the Current Medication list given to you today.

## 2015-01-02 ENCOUNTER — Ambulatory Visit (INDEPENDENT_AMBULATORY_CARE_PROVIDER_SITE_OTHER): Payer: Managed Care, Other (non HMO) | Admitting: *Deleted

## 2015-01-02 DIAGNOSIS — Z7901 Long term (current) use of anticoagulants: Secondary | ICD-10-CM

## 2015-01-02 DIAGNOSIS — Z8679 Personal history of other diseases of the circulatory system: Secondary | ICD-10-CM | POA: Diagnosis not present

## 2015-01-02 DIAGNOSIS — I4891 Unspecified atrial fibrillation: Secondary | ICD-10-CM

## 2015-01-02 DIAGNOSIS — Z5181 Encounter for therapeutic drug level monitoring: Secondary | ICD-10-CM

## 2015-01-02 LAB — POCT INR: INR: 2.2

## 2015-01-30 ENCOUNTER — Other Ambulatory Visit: Payer: Self-pay | Admitting: *Deleted

## 2015-01-30 MED ORDER — WARFARIN SODIUM 5 MG PO TABS: ORAL_TABLET | ORAL | Status: DC

## 2015-02-13 ENCOUNTER — Ambulatory Visit (INDEPENDENT_AMBULATORY_CARE_PROVIDER_SITE_OTHER): Payer: Managed Care, Other (non HMO) | Admitting: *Deleted

## 2015-02-13 ENCOUNTER — Other Ambulatory Visit: Payer: Self-pay | Admitting: *Deleted

## 2015-02-13 DIAGNOSIS — Z7901 Long term (current) use of anticoagulants: Secondary | ICD-10-CM | POA: Diagnosis not present

## 2015-02-13 DIAGNOSIS — Z8679 Personal history of other diseases of the circulatory system: Secondary | ICD-10-CM | POA: Diagnosis not present

## 2015-02-13 DIAGNOSIS — Z5181 Encounter for therapeutic drug level monitoring: Secondary | ICD-10-CM

## 2015-02-13 DIAGNOSIS — I4891 Unspecified atrial fibrillation: Secondary | ICD-10-CM

## 2015-02-13 LAB — POCT INR: INR: 2.5

## 2015-02-13 MED ORDER — METOPROLOL SUCCINATE ER 50 MG PO TB24: 50.0000 mg | ORAL_TABLET | Freq: Every evening | ORAL | Status: DC

## 2015-03-28 ENCOUNTER — Ambulatory Visit (INDEPENDENT_AMBULATORY_CARE_PROVIDER_SITE_OTHER): Payer: Managed Care, Other (non HMO)

## 2015-03-28 DIAGNOSIS — I4891 Unspecified atrial fibrillation: Secondary | ICD-10-CM

## 2015-03-28 DIAGNOSIS — Z5181 Encounter for therapeutic drug level monitoring: Secondary | ICD-10-CM | POA: Diagnosis not present

## 2015-03-28 DIAGNOSIS — Z8679 Personal history of other diseases of the circulatory system: Secondary | ICD-10-CM | POA: Diagnosis not present

## 2015-03-28 DIAGNOSIS — Z7901 Long term (current) use of anticoagulants: Secondary | ICD-10-CM | POA: Diagnosis not present

## 2015-03-28 LAB — POCT INR: INR: 2.4

## 2015-04-26 ENCOUNTER — Telehealth: Payer: Self-pay | Admitting: Pharmacist

## 2015-04-26 NOTE — Telephone Encounter (Signed)
Started doxycycline 100mg  BID x10 days and benzonatate yesterday 2/28. Will have pt come in for INR check on Friday 3/3.

## 2015-04-28 ENCOUNTER — Ambulatory Visit (INDEPENDENT_AMBULATORY_CARE_PROVIDER_SITE_OTHER): Payer: Managed Care, Other (non HMO) | Admitting: *Deleted

## 2015-04-28 DIAGNOSIS — Z7901 Long term (current) use of anticoagulants: Secondary | ICD-10-CM

## 2015-04-28 DIAGNOSIS — Z5181 Encounter for therapeutic drug level monitoring: Secondary | ICD-10-CM

## 2015-04-28 DIAGNOSIS — Z8679 Personal history of other diseases of the circulatory system: Secondary | ICD-10-CM | POA: Diagnosis not present

## 2015-04-28 DIAGNOSIS — I4891 Unspecified atrial fibrillation: Secondary | ICD-10-CM | POA: Diagnosis not present

## 2015-04-28 LAB — POCT INR: INR: 2.3

## 2015-05-01 ENCOUNTER — Ambulatory Visit (INDEPENDENT_AMBULATORY_CARE_PROVIDER_SITE_OTHER): Payer: Managed Care, Other (non HMO) | Admitting: *Deleted

## 2015-05-01 DIAGNOSIS — I4891 Unspecified atrial fibrillation: Secondary | ICD-10-CM

## 2015-05-01 DIAGNOSIS — Z7901 Long term (current) use of anticoagulants: Secondary | ICD-10-CM | POA: Diagnosis not present

## 2015-05-01 DIAGNOSIS — Z8679 Personal history of other diseases of the circulatory system: Secondary | ICD-10-CM

## 2015-05-01 DIAGNOSIS — Z5181 Encounter for therapeutic drug level monitoring: Secondary | ICD-10-CM

## 2015-05-01 LAB — POCT INR: INR: 1.8

## 2015-05-05 ENCOUNTER — Ambulatory Visit (INDEPENDENT_AMBULATORY_CARE_PROVIDER_SITE_OTHER): Payer: Managed Care, Other (non HMO) | Admitting: *Deleted

## 2015-05-05 DIAGNOSIS — I4891 Unspecified atrial fibrillation: Secondary | ICD-10-CM

## 2015-05-05 DIAGNOSIS — Z5181 Encounter for therapeutic drug level monitoring: Secondary | ICD-10-CM

## 2015-05-05 DIAGNOSIS — Z8679 Personal history of other diseases of the circulatory system: Secondary | ICD-10-CM

## 2015-05-05 DIAGNOSIS — Z7901 Long term (current) use of anticoagulants: Secondary | ICD-10-CM | POA: Diagnosis not present

## 2015-05-05 LAB — POCT INR: INR: 3.5

## 2015-05-19 ENCOUNTER — Ambulatory Visit (INDEPENDENT_AMBULATORY_CARE_PROVIDER_SITE_OTHER): Payer: Managed Care, Other (non HMO) | Admitting: *Deleted

## 2015-05-19 DIAGNOSIS — Z8679 Personal history of other diseases of the circulatory system: Secondary | ICD-10-CM

## 2015-05-19 DIAGNOSIS — I4891 Unspecified atrial fibrillation: Secondary | ICD-10-CM | POA: Diagnosis not present

## 2015-05-19 DIAGNOSIS — Z7901 Long term (current) use of anticoagulants: Secondary | ICD-10-CM

## 2015-05-19 DIAGNOSIS — Z5181 Encounter for therapeutic drug level monitoring: Secondary | ICD-10-CM

## 2015-05-19 LAB — POCT INR: INR: 2.8

## 2015-05-26 ENCOUNTER — Other Ambulatory Visit: Payer: Self-pay | Admitting: Cardiovascular Disease

## 2015-06-12 ENCOUNTER — Other Ambulatory Visit: Payer: Self-pay | Admitting: Cardiovascular Disease

## 2015-06-13 NOTE — Progress Notes (Signed)
Patient ID: Linda Ruiz, female   DOB: 02-17-1951, 65 y.o.   MRN: [id number]   Linda Ruiz is seen today for F/U of her chronic afib, hypertension. She is asymptomatic with no palpitations, PNC, orthopnea, diaphoresis or syncope. She continues to work in [employer]. She had a spontaneous right rectus muscle hematoma on 10/24/11.  Cleaning it and thinks she pressed too hard on the tub for a long period of time. Was off coumadin for a few weeks and hematoma resolve with no ill effects    Echo 12/06/13 Reviewed  Study Conclusions  - Left ventricle: The cavity size was normal. Wall thickness was normal. Systolic function was normal. The estimated ejection fraction was in the range of 55% to 60%. Wall motion was normal; there were no regional wall motion abnormalities. - Mitral valve: Calcified annulus. - Left atrium: The atrium was mildly dilated. - Pulmonary arteries: Systolic pressure was mildly increased. PA peak pressure: 41 mm Hg (S).  Impressions:  - Normal LV function; mild LAE; mildly elevated pulmonary pressure.  ROS: Denies fever, malais, weight loss, blurry vision, decreased visual acuity, cough, sputum, SOB, hemoptysis, pleuritic pain, palpitaitons, heartburn, abdominal pain, melena, lower extremity edema, claudication, or rash.  All other systems reviewed and negative  General: Affect appropriate Healthy:  appears stated age 5: normal Neck supple with no adenopathy JVP normal no bruits no thyromegaly Lungs clear with no wheezing and good diaphragmatic motion Heart:  S1/S2 no murmur, no rub, gallop or click PMI normal Abdomen: benighn, BS positve, no tenderness, no AAA no bruit.  No HSM or HJR Distal pulses intact with no bruits No edema Neuro non-focal Skin warm and dry No muscular weakness   Current Outpatient Prescriptions  Medication Sig Dispense Refill  . atorvastatin (LIPITOR) 20 MG tablet Take 20 mg by mouth every evening.     .  clindamycin (CLEOCIN) 2 % vaginal cream One applicator every hs vaginally x 5 only 40 g 0  . metoprolol succinate (TOPROL-XL) 50 MG 24 hr tablet take 1 tablet by mouth every evening IMMEDIATELY FOLLOWING A MEAL 30 tablet 0  . quinapril (ACCUPRIL) 20 MG tablet Take 20 mg by mouth at bedtime.      . sertraline (ZOLOFT) 50 MG tablet Take 50 mg by mouth at bedtime.     . verapamil (CALAN-SR) 240 MG CR tablet take 1 tablet by mouth at bedtime 90 tablet 0  . warfarin (COUMADIN) 5 MG tablet Take as directed by Anticoagulation clinic 35 tablet 3   No current facility-administered medications for this visit.    Allergies  Sulfa antibiotics and Sulfonamide derivatives  Electrocardiogram:   11/30/13  Afib rate 91  Low voltage   06/16/15 afib nonspecific ST changes otherwise normal rate   Assessment and Plan Chronic Afib: good rate control and anticoagulation INR 2.2 no bleeding issues  HTN: Well controlled.  Continue current medications and low sodium Dash type diet.   Anticoagulation: f/u coumadin clinic Rx Chol: f/u labs Aronson on statin  Depression: better with zoloft continue    [employer]

## 2015-06-14 ENCOUNTER — Encounter: Payer: Self-pay | Admitting: Certified Nurse Midwife

## 2015-06-14 ENCOUNTER — Ambulatory Visit (INDEPENDENT_AMBULATORY_CARE_PROVIDER_SITE_OTHER): Payer: Managed Care, Other (non HMO) | Admitting: Certified Nurse Midwife

## 2015-06-14 VITALS — BP 122/80 | HR 74 | Resp 16 | Ht 62.25 in | Wt 211.0 lb

## 2015-06-14 DIAGNOSIS — R6882 Decreased libido: Secondary | ICD-10-CM

## 2015-06-14 DIAGNOSIS — Z01419 Encounter for gynecological examination (general) (routine) without abnormal findings: Secondary | ICD-10-CM | POA: Diagnosis not present

## 2015-06-14 DIAGNOSIS — Z124 Encounter for screening for malignant neoplasm of cervix: Secondary | ICD-10-CM

## 2015-06-14 NOTE — Progress Notes (Signed)
65 y.o. G51P1001 Married  Caucasian Fe here for annual exam. Denies vaginal bleeding or vaginal dryness. Has noted decrease libido, but plans to work on with spouse. Feels it is medication she is on, but also work hectic. Sees Dr. Benedetto Coons does labs and medication management  for hypertension, cholesterol and depression. All medications stable. Had asthma episode with steroid use, which caused slight weight gain. Working on smoking cessation and down to 3 cigarettes daily now. Plans to stop soon. Working on weight loss, but going slowly. Very busy at work for herself and spouse. No other health issues today. Needs to plan vacation!  Patient's last menstrual period was 10/27/2006.          Sexually active: No.  The current method of family planning is tubal ligation.    Exercising: No.  exercise Smoker:  yes  Health Maintenance: Pap: 06-08-14 neg MMG:  At work 2015 neg Colonoscopy:  5/13 polyps, diverticulitis-repeat 62yrs BMD:   2009 TDaP:  2016 Shingles: no Pneumonia: no Hep C and HIV: not done, declines Labs: pcp Self breast exam: done occ   reports that she has been smoking Cigarettes.  She has smoked for the past 8 years. She has never used smokeless tobacco. She reports that she does not drink alcohol or use illicit drugs.  Past Medical History  Diagnosis Date  . Atrial fibrillation ([place])   . Hypertension   . Hyperlipidemia   . Anxiety   . Skin cancer   . Migraine   . 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 Wears dentures     top  . Wears glasses   . Sleep apnea     uses cpap  . Fracture of wrist, closed 08/2012    Left    Past Surgical History  Procedure Laterality Date  . Cesarean section    . Tubaligation    . Polypectomy      endometrial polyp removed 2005 D&C  . Foot surgery  05/2010    Left  . Skin cancer removal      this past year and five years ago  . Open reduction internal fixation (orif) distal radial fracture Left  09/08/2012    Procedure: OPEN REDUCTION INTERNAL FIXATION (ORIF) DISTAL RADIAL FRACTURE;  Surgeon: Tennis Must, MD;  Location: [facility];  Service: Orthopedics;  Laterality: Left;  . Carpal tunnel release Left 01/19/2013    Procedure: LEFT CARPAL TUNNEL RELEASE;  Surgeon: Tennis Must, MD;  Location: [facility];  Service: Orthopedics;  Laterality: Left;    Current Outpatient Prescriptions  Medication Sig Dispense Refill  . atorvastatin (LIPITOR) 20 MG tablet Take 20 mg by mouth every evening.     . metoprolol succinate (TOPROL-XL) 50 MG 24 hr tablet take 1 tablet by mouth every evening IMMEDIATELY FOLLOWING A MEAL 30 tablet 0  . quinapril (ACCUPRIL) 20 MG tablet Take 20 mg by mouth at bedtime.      . sertraline (ZOLOFT) 50 MG tablet Take 50 mg by mouth at bedtime.     . verapamil (CALAN-SR) 240 MG CR tablet take 1 tablet by mouth at bedtime 90 tablet 0  . warfarin (COUMADIN) 5 MG tablet Take as directed by Anticoagulation clinic 35 tablet 3   No current facility-administered medications for this visit.    Family History  Problem Relation Age of Onset  . Colon cancer Neg Hx   . Esophageal cancer Neg Hx   .  Rectal cancer Neg Hx   . Stomach cancer Neg Hx   . Cancer Mother     lung  . Cancer Father     lung  . Cancer Paternal Grandmother     breast  . COPD Sister   . Emphysema Sister     ROS:  Pertinent items are noted in HPI.  Otherwise, a comprehensive ROS was negative.  Exam:   BP 122/80 mmHg  Pulse 74  Resp 16  Ht 5' 2.25" (1.581 m)  Wt 211 lb (95.709 kg)  BMI 38.29 kg/m2  LMP 10/27/2006 Height: 5' 2.25" (158.1 cm) Ht Readings from Last 3 Encounters:  06/14/15 5' 2.25" (1.581 m)  06/08/14 5' 2.25" (1.581 m)  05/30/14 5\' 1"  (1.549 m)    General appearance: alert, cooperative and appears stated age Head: Normocephalic, without obvious abnormality, atraumatic Neck: no adenopathy, supple, symmetrical, trachea midline and thyroid normal  to inspection and palpation Lungs: clear to auscultation bilaterally Breasts: normal appearance, no masses or tenderness, No nipple retraction or dimpling, No nipple discharge or bleeding, No axillary or supraclavicular adenopathy Heart: regular rate and rhythm Abdomen: soft, non-tender; no masses,  no organomegaly Extremities: extremities normal, atraumatic, no cyanosis or edema Skin: Skin color, texture, turgor normal. No rashes or lesions Lymph nodes: Cervical, supraclavicular, and axillary nodes normal. No abnormal inguinal nodes palpated Neurologic: Grossly normal   Pelvic: External genitalia:  no lesions              Urethra:  normal appearing urethra with no masses, tenderness or lesions              Bartholin's and Skene's: normal                 Vagina: normal appearing vagina with normal color and discharge, no lesions              Cervix: normal,non tender,no lesions              Pap taken: Yes.   Bimanual Exam:  Uterus:  normal size, contour, position, consistency, mobility, non-tender              Adnexa: normal adnexa and no mass, fullness, tenderness               Rectovaginal: Confirms               Anus:  normal sphincter tone, no lesions, hemorrhoid tags noted only  Chaperone present: yes  A:  Well Woman with normal exam  Menopausal no HRT  Decrease libido ? Related to medication for depression, slight vaginal dryness  History of VIN 3, no changes noted on exam  Mammogram over due, patient will schedule  Hypertension, cholesterol, depression with PCP management  Atrial Fib with cardiology management  P:   Reviewed health and wellness pertinent to exam  Aware of need to advise if vaginal bleeding.  Discussed taking time out to schedule date night and discuss concerns together. Discussed using coconut oil for moisture and spouse applying to see this helps also. Discussed no physical appearance problems of vagina except for slight dryness. Also encouraged patient to  discuss with PCP with possible medication change. Questions addressed at length. Declines labs.  Continue follow up with MD's as indicated  Pap smear as above with HPV reflex   counseled on breast self exam, mammography screening, adequate intake of calcium and vitamin D, diet and exercise  return annually or prn  An After Visit Summary was  printed and given to the patient.

## 2015-06-14 NOTE — Patient Instructions (Signed)

## 2015-06-15 LAB — IPS PAP TEST WITH REFLEX TO HPV

## 2015-06-16 ENCOUNTER — Other Ambulatory Visit: Payer: Self-pay | Admitting: Certified Nurse Midwife

## 2015-06-16 ENCOUNTER — Encounter: Payer: Self-pay | Admitting: Cardiovascular Disease

## 2015-06-16 ENCOUNTER — Ambulatory Visit (INDEPENDENT_AMBULATORY_CARE_PROVIDER_SITE_OTHER): Payer: Managed Care, Other (non HMO) | Admitting: Cardiovascular Disease

## 2015-06-16 ENCOUNTER — Ambulatory Visit (INDEPENDENT_AMBULATORY_CARE_PROVIDER_SITE_OTHER): Payer: Managed Care, Other (non HMO)

## 2015-06-16 ENCOUNTER — Other Ambulatory Visit: Payer: Self-pay

## 2015-06-16 VITALS — BP 124/82 | HR 75 | Ht 62.5 in | Wt 210.0 lb

## 2015-06-16 DIAGNOSIS — B9689 Other specified bacterial agents as the cause of diseases classified elsewhere: Secondary | ICD-10-CM

## 2015-06-16 DIAGNOSIS — I4891 Unspecified atrial fibrillation: Secondary | ICD-10-CM

## 2015-06-16 DIAGNOSIS — N760 Acute vaginitis: Principal | ICD-10-CM

## 2015-06-16 DIAGNOSIS — I10 Essential (primary) hypertension: Secondary | ICD-10-CM

## 2015-06-16 DIAGNOSIS — Z7901 Long term (current) use of anticoagulants: Secondary | ICD-10-CM | POA: Diagnosis not present

## 2015-06-16 DIAGNOSIS — Z5181 Encounter for therapeutic drug level monitoring: Secondary | ICD-10-CM | POA: Diagnosis not present

## 2015-06-16 DIAGNOSIS — Z8679 Personal history of other diseases of the circulatory system: Secondary | ICD-10-CM | POA: Diagnosis not present

## 2015-06-16 LAB — POCT INR: INR: 2.2

## 2015-06-16 MED ORDER — CLINDAMYCIN PHOSPHATE 2 % VA CREA: TOPICAL_CREAM | VAGINAL | Status: DC

## 2015-06-16 NOTE — Patient Instructions (Addendum)

## 2015-06-21 ENCOUNTER — Telehealth: Payer: Self-pay | Admitting: Certified Nurse Midwife

## 2015-06-21 NOTE — Telephone Encounter (Signed)
Patient says she's having problems urinating and would like to speak with nurse.

## 2015-06-21 NOTE — Telephone Encounter (Signed)
Spoke with patient. Patient was seen in the office for her aex on 06/14/2015. Pap showed BV and she is being treated with Metrogel x 5 days. States that today will be her last dose of Metrogel. Reports she is now experiencing urinary urgency and frequency with minimal output. Denies any lower back pain , fever, or chills. Advised she will need to be seen in the office for further evaluation. Offered appointment today, but she declines. Appointment scheduled for tomorrow 06/22/2015 at 2 pm. She is agreeable to date and time. Advised if symptoms worsen, she develops and lower back pain ,fever, or chills she will need to be seen at a local Urgent care or ER if it is after office hours. She is agreeable.  Routing to provider for final review. Patient agreeable to disposition. Will close encounter.

## 2015-06-22 ENCOUNTER — Ambulatory Visit (INDEPENDENT_AMBULATORY_CARE_PROVIDER_SITE_OTHER): Payer: Managed Care, Other (non HMO) | Admitting: Certified Nurse Midwife

## 2015-06-22 ENCOUNTER — Encounter: Payer: Self-pay | Admitting: Certified Nurse Midwife

## 2015-06-22 VITALS — BP 104/62 | HR 64 | Temp 98.2°F | Resp 16 | Ht 62.25 in | Wt 209.0 lb

## 2015-06-22 DIAGNOSIS — N390 Urinary tract infection, site not specified: Secondary | ICD-10-CM | POA: Diagnosis not present

## 2015-06-22 DIAGNOSIS — R319 Hematuria, unspecified: Secondary | ICD-10-CM

## 2015-06-22 LAB — POCT URINALYSIS DIPSTICK
BILIRUBIN UA: NEGATIVE
GLUCOSE UA: NEGATIVE
Ketones, UA: NEGATIVE
LEUKOCYTES UA: NEGATIVE
NITRITE UA: POSITIVE
Protein, UA: NEGATIVE
UROBILINOGEN UA: NEGATIVE
pH, UA: 5

## 2015-06-22 MED ORDER — PHENAZOPYRIDINE HCL 100 MG PO TABS: 100.0000 mg | ORAL_TABLET | Freq: Three times a day (TID) | ORAL | Status: DC | PRN

## 2015-06-22 MED ORDER — NITROFURANTOIN MONOHYD MACRO 100 MG PO CAPS: 100.0000 mg | ORAL_CAPSULE | Freq: Two times a day (BID) | ORAL | Status: DC

## 2015-06-22 NOTE — Progress Notes (Signed)
Encounter reviewed Ravenna Legore, MD   

## 2015-06-22 NOTE — Patient Instructions (Signed)

## 2015-06-22 NOTE — Progress Notes (Signed)
65 y.o. Married Caucasian female [id number] here with complaint of UTI, with onset  on 2 days. Patient complaining of urinary frequency/urgency/ and pain with urination. Patient denies fever, chills, nausea or back pain. No new personal products. Patient feels not related to sexual activity. Denies any vaginal symptoms. . Menopausal with vaginal dryness, using OTC moisture.. Patient not drinking adequate water intake. "Just feels tired". No other health issues today.   O: Healthy female WDWN Affect: Normal, orientation x 3 Skin : warm and dry CVAT: negative bilateral Abdomen: positive for suprapubic tenderness  Pelvic exam: External genital area: normal, no lesions Bladder,Urethra tender, Urethral meatus: tender, red Vagina: normal vaginal discharge, normal appearance  Wet prep not taken Cervix: normal, non tender Uterus:normal,non tender Adnexa: normal non tender, no fullness or masses   A: UTI Normal pelvic exam poct urine- nitrite positive, rbc 1+ P: Reviewed findings of UTI and need for treatment. [id number] see order with instructions Rx Pyridium see order with instructions Lab: Micro urine  Reviewed warning signs and symptoms of UTI and need to advise if occurring. Encouraged to limit soda, tea, and coffee and be sure to increase water intake. Call if symptoms are not resolving.   RV prn

## 2015-06-23 LAB — URINALYSIS, MICROSCOPIC ONLY
CASTS: NONE SEEN [LPF]
CRYSTALS: NONE SEEN [HPF]
Yeast: NONE SEEN [HPF]

## 2015-06-23 NOTE — Progress Notes (Signed)
Encounter reviewed Anakaren Campion, MD   

## 2015-06-27 ENCOUNTER — Other Ambulatory Visit: Payer: Self-pay | Admitting: Cardiovascular Disease

## 2015-07-13 ENCOUNTER — Other Ambulatory Visit: Payer: Self-pay | Admitting: Cardiovascular Disease

## 2015-07-14 ENCOUNTER — Ambulatory Visit (INDEPENDENT_AMBULATORY_CARE_PROVIDER_SITE_OTHER): Payer: Managed Care, Other (non HMO) | Admitting: *Deleted

## 2015-07-14 ENCOUNTER — Other Ambulatory Visit: Payer: Self-pay | Admitting: *Deleted

## 2015-07-14 DIAGNOSIS — I4891 Unspecified atrial fibrillation: Secondary | ICD-10-CM

## 2015-07-14 DIAGNOSIS — Z5181 Encounter for therapeutic drug level monitoring: Secondary | ICD-10-CM

## 2015-07-14 DIAGNOSIS — Z7901 Long term (current) use of anticoagulants: Secondary | ICD-10-CM

## 2015-07-14 DIAGNOSIS — Z8679 Personal history of other diseases of the circulatory system: Secondary | ICD-10-CM

## 2015-07-14 LAB — POCT INR: INR: 2.1

## 2015-07-14 MED ORDER — METOPROLOL SUCCINATE ER 50 MG PO TB24: 50.0000 mg | ORAL_TABLET | Freq: Every day | ORAL | Status: DC

## 2015-08-11 ENCOUNTER — Ambulatory Visit (INDEPENDENT_AMBULATORY_CARE_PROVIDER_SITE_OTHER): Payer: Managed Care, Other (non HMO) | Admitting: *Deleted

## 2015-08-11 DIAGNOSIS — Z8679 Personal history of other diseases of the circulatory system: Secondary | ICD-10-CM

## 2015-08-11 DIAGNOSIS — I4891 Unspecified atrial fibrillation: Secondary | ICD-10-CM

## 2015-08-11 DIAGNOSIS — Z5181 Encounter for therapeutic drug level monitoring: Secondary | ICD-10-CM | POA: Diagnosis not present

## 2015-08-11 DIAGNOSIS — Z7901 Long term (current) use of anticoagulants: Secondary | ICD-10-CM

## 2015-08-11 LAB — POCT INR: INR: 2.4

## 2015-08-28 ENCOUNTER — Other Ambulatory Visit: Payer: Self-pay | Admitting: Cardiovascular Disease

## 2015-09-22 ENCOUNTER — Ambulatory Visit (INDEPENDENT_AMBULATORY_CARE_PROVIDER_SITE_OTHER): Payer: Managed Care, Other (non HMO) | Admitting: *Deleted

## 2015-09-22 DIAGNOSIS — I4891 Unspecified atrial fibrillation: Secondary | ICD-10-CM

## 2015-09-22 DIAGNOSIS — Z5181 Encounter for therapeutic drug level monitoring: Secondary | ICD-10-CM | POA: Diagnosis not present

## 2015-09-22 DIAGNOSIS — Z7901 Long term (current) use of anticoagulants: Secondary | ICD-10-CM | POA: Diagnosis not present

## 2015-09-22 DIAGNOSIS — Z8679 Personal history of other diseases of the circulatory system: Secondary | ICD-10-CM

## 2015-09-22 LAB — POCT INR: INR: 2.7

## 2015-11-03 ENCOUNTER — Ambulatory Visit (INDEPENDENT_AMBULATORY_CARE_PROVIDER_SITE_OTHER): Payer: Managed Care, Other (non HMO) | Admitting: *Deleted

## 2015-11-03 DIAGNOSIS — Z5181 Encounter for therapeutic drug level monitoring: Secondary | ICD-10-CM | POA: Diagnosis not present

## 2015-11-03 DIAGNOSIS — I4891 Unspecified atrial fibrillation: Secondary | ICD-10-CM | POA: Diagnosis not present

## 2015-11-03 DIAGNOSIS — Z8679 Personal history of other diseases of the circulatory system: Secondary | ICD-10-CM

## 2015-11-03 DIAGNOSIS — Z7901 Long term (current) use of anticoagulants: Secondary | ICD-10-CM | POA: Diagnosis not present

## 2015-11-03 LAB — POCT INR: INR: 2.2

## 2015-11-29 ENCOUNTER — Other Ambulatory Visit: Payer: Self-pay | Admitting: Cardiovascular Disease

## 2015-12-05 ENCOUNTER — Ambulatory Visit (INDEPENDENT_AMBULATORY_CARE_PROVIDER_SITE_OTHER): Payer: Managed Care, Other (non HMO) | Admitting: *Deleted

## 2015-12-05 DIAGNOSIS — Z5181 Encounter for therapeutic drug level monitoring: Secondary | ICD-10-CM | POA: Diagnosis not present

## 2015-12-05 DIAGNOSIS — I4891 Unspecified atrial fibrillation: Secondary | ICD-10-CM | POA: Diagnosis not present

## 2015-12-05 DIAGNOSIS — Z7901 Long term (current) use of anticoagulants: Secondary | ICD-10-CM | POA: Diagnosis not present

## 2015-12-05 DIAGNOSIS — Z8679 Personal history of other diseases of the circulatory system: Secondary | ICD-10-CM | POA: Diagnosis not present

## 2015-12-05 LAB — POCT INR: INR: 1.7

## 2015-12-19 ENCOUNTER — Ambulatory Visit (INDEPENDENT_AMBULATORY_CARE_PROVIDER_SITE_OTHER): Payer: Managed Care, Other (non HMO) | Admitting: *Deleted

## 2015-12-19 DIAGNOSIS — Z7901 Long term (current) use of anticoagulants: Secondary | ICD-10-CM | POA: Diagnosis not present

## 2015-12-19 DIAGNOSIS — Z8679 Personal history of other diseases of the circulatory system: Secondary | ICD-10-CM | POA: Diagnosis not present

## 2015-12-19 DIAGNOSIS — I4891 Unspecified atrial fibrillation: Secondary | ICD-10-CM

## 2015-12-19 DIAGNOSIS — Z5181 Encounter for therapeutic drug level monitoring: Secondary | ICD-10-CM | POA: Diagnosis not present

## 2015-12-19 LAB — POCT INR: INR: 2.5

## 2016-02-07 ENCOUNTER — Ambulatory Visit (INDEPENDENT_AMBULATORY_CARE_PROVIDER_SITE_OTHER): Payer: Managed Care, Other (non HMO) | Admitting: *Deleted

## 2016-02-07 DIAGNOSIS — Z5181 Encounter for therapeutic drug level monitoring: Secondary | ICD-10-CM

## 2016-02-07 DIAGNOSIS — I4891 Unspecified atrial fibrillation: Secondary | ICD-10-CM | POA: Diagnosis not present

## 2016-02-07 DIAGNOSIS — Z8679 Personal history of other diseases of the circulatory system: Secondary | ICD-10-CM | POA: Diagnosis not present

## 2016-02-07 DIAGNOSIS — Z7901 Long term (current) use of anticoagulants: Secondary | ICD-10-CM

## 2016-02-07 LAB — POCT INR: INR: 2.5

## 2016-03-20 ENCOUNTER — Ambulatory Visit (INDEPENDENT_AMBULATORY_CARE_PROVIDER_SITE_OTHER): Payer: Managed Care, Other (non HMO) | Admitting: *Deleted

## 2016-03-20 DIAGNOSIS — Z5181 Encounter for therapeutic drug level monitoring: Secondary | ICD-10-CM

## 2016-03-20 DIAGNOSIS — Z7901 Long term (current) use of anticoagulants: Secondary | ICD-10-CM

## 2016-03-20 DIAGNOSIS — I4891 Unspecified atrial fibrillation: Secondary | ICD-10-CM

## 2016-03-20 DIAGNOSIS — Z8679 Personal history of other diseases of the circulatory system: Secondary | ICD-10-CM | POA: Diagnosis not present

## 2016-03-20 LAB — POCT INR: INR: 2.3

## 2016-04-03 ENCOUNTER — Telehealth: Payer: Self-pay | Admitting: Cardiovascular Disease

## 2016-04-03 NOTE — Telephone Encounter (Signed)
LMTCB

## 2016-04-03 NOTE — Telephone Encounter (Signed)
New Message:   Pt have question about medication

## 2016-04-04 NOTE — Telephone Encounter (Signed)
Pt called back to clinic. States she was started on cefdinir yesterday. Advised her no interaction with Coumadin. Will keep next f/u appt in March as scheduled.

## 2016-05-01 ENCOUNTER — Ambulatory Visit (INDEPENDENT_AMBULATORY_CARE_PROVIDER_SITE_OTHER): Payer: Managed Care, Other (non HMO) | Admitting: *Deleted

## 2016-05-01 DIAGNOSIS — Z8679 Personal history of other diseases of the circulatory system: Secondary | ICD-10-CM

## 2016-05-01 DIAGNOSIS — Z5181 Encounter for therapeutic drug level monitoring: Secondary | ICD-10-CM

## 2016-05-01 DIAGNOSIS — Z7901 Long term (current) use of anticoagulants: Secondary | ICD-10-CM | POA: Diagnosis not present

## 2016-05-01 DIAGNOSIS — I4891 Unspecified atrial fibrillation: Secondary | ICD-10-CM

## 2016-05-01 LAB — POCT INR: INR: 3

## 2016-05-05 ENCOUNTER — Other Ambulatory Visit: Payer: Self-pay | Admitting: Cardiovascular Disease

## 2016-05-23 ENCOUNTER — Encounter: Payer: Self-pay | Admitting: Gastroenterology

## 2016-05-26 ENCOUNTER — Other Ambulatory Visit: Payer: Self-pay | Admitting: Cardiovascular Disease

## 2016-05-31 ENCOUNTER — Encounter: Payer: Self-pay | Admitting: *Deleted

## 2016-06-12 ENCOUNTER — Ambulatory Visit (INDEPENDENT_AMBULATORY_CARE_PROVIDER_SITE_OTHER): Payer: Managed Care, Other (non HMO) | Admitting: *Deleted

## 2016-06-12 DIAGNOSIS — Z5181 Encounter for therapeutic drug level monitoring: Secondary | ICD-10-CM

## 2016-06-12 DIAGNOSIS — Z8679 Personal history of other diseases of the circulatory system: Secondary | ICD-10-CM

## 2016-06-12 DIAGNOSIS — I4891 Unspecified atrial fibrillation: Secondary | ICD-10-CM

## 2016-06-12 DIAGNOSIS — Z7901 Long term (current) use of anticoagulants: Secondary | ICD-10-CM | POA: Diagnosis not present

## 2016-06-12 LAB — POCT INR: INR: 2.3

## 2016-06-13 ENCOUNTER — Encounter: Payer: Self-pay | Admitting: Certified Nurse Midwife

## 2016-06-14 ENCOUNTER — Ambulatory Visit: Payer: Managed Care, Other (non HMO) | Admitting: Certified Nurse Midwife

## 2016-06-17 NOTE — Progress Notes (Signed)
Patient ID: Linda Ruiz, female   DOB: Dec 12, 1950, 66 y.o.   MRN: [id number]   Linda Ruiz is seen today for F/U of her chronic afib, hypertension. She is asymptomatic with no palpitations, PNC, orthopnea, diaphoresis or syncope. She continues to work in [employer]. She had a spontaneous right rectus muscle hematoma on 10/24/11.  Was off coumadin for a few weeks and hematoma resolve with no ill effects    Echo 12/06/13 Reviewed  Study Conclusions  - Left ventricle: The cavity size was normal. Wall thickness was normal. Systolic function was normal. The estimated ejection fraction was in the range of 55% to 60%. Wall motion was normal; there were no regional wall motion abnormalities. - Mitral valve: Calcified annulus. - Left atrium: The atrium was mildly dilated. - Pulmonary arteries: Systolic pressure was mildly increased. PA peak pressure: 41 mm Hg (S).  Impressions:  - Normal LV function; mild LAE; mildly elevated pulmonary pressure.  ROS: Denies fever, malais, weight loss, blurry vision, decreased visual acuity, cough, sputum, SOB, hemoptysis, pleuritic pain, palpitaitons, heartburn, abdominal pain, melena, lower extremity edema, claudication, or rash.  All other systems reviewed and negative  General: Affect appropriate Healthy:  appears stated age 66: normal Neck supple with no adenopathy JVP normal no bruits no thyromegaly Lungs clear with no wheezing and good diaphragmatic motion Heart:  S1/S2 no murmur, no rub, gallop or click PMI normal Abdomen: benighn, BS positve, no tenderness, no AAA no bruit.  No HSM or HJR Distal pulses intact with no bruits No edema Neuro non-focal Skin warm and dry No muscular weakness   Current Outpatient Prescriptions  Medication Sig Dispense Refill  . atorvastatin (LIPITOR) 20 MG tablet Take 20 mg by mouth every evening.     . metoprolol succinate (TOPROL-XL) 50 MG 24 hr tablet Take 1 tablet (50 mg total) by mouth  daily. Take with or immediately following a meal. 30 tablet 11  . quinapril (ACCUPRIL) 20 MG tablet Take 20 mg by mouth at bedtime.      . sertraline (ZOLOFT) 50 MG tablet Take 50 mg by mouth at bedtime.     . verapamil (CALAN-SR) 240 MG CR tablet take 1 tablet by mouth at bedtime 90 tablet 0  . warfarin (COUMADIN) 5 MG tablet take tablets by mouth as directed BY COUMADIN CLINIC 35 tablet 3   No current facility-administered medications for this visit.     Allergies  Sulfa antibiotics and Sulfonamide derivatives  Electrocardiogram:   11/30/13  Afib rate 91  Low voltage   06/16/15 afib nonspecific ST changes otherwise normal rate 06/19/16  afib rate 53 low voltage nonspecific ST/T wave changes   Assessment and Plan Chronic Afib: good rate control and anticoagulation INR 2.3 06/12/16  no bleeding issues  HTN: Well controlled.  Continue current medications and low sodium Dash type diet.   Anticoagulation: f/u coumadin clinic Rx out 6 weeks at a time  Chol: f/u labs Reynaldo Minium on statin  Depression: better with zoloft continue    [employer]

## 2016-06-19 ENCOUNTER — Encounter: Payer: Self-pay | Admitting: Cardiovascular Disease

## 2016-06-19 ENCOUNTER — Ambulatory Visit (INDEPENDENT_AMBULATORY_CARE_PROVIDER_SITE_OTHER): Payer: Managed Care, Other (non HMO) | Admitting: Cardiovascular Disease

## 2016-06-19 ENCOUNTER — Encounter (INDEPENDENT_AMBULATORY_CARE_PROVIDER_SITE_OTHER): Payer: Self-pay

## 2016-06-19 VITALS — BP 124/60 | HR 50 | Ht 62.0 in | Wt 203.4 lb

## 2016-06-19 DIAGNOSIS — I4891 Unspecified atrial fibrillation: Secondary | ICD-10-CM

## 2016-06-19 NOTE — Patient Instructions (Signed)

## 2016-06-24 ENCOUNTER — Telehealth: Payer: Self-pay | Admitting: Certified Nurse Midwife

## 2016-06-24 NOTE — Telephone Encounter (Signed)
Left patient a message to call back and ask for Starla re: missed appointment.

## 2016-07-13 ENCOUNTER — Other Ambulatory Visit: Payer: Self-pay | Admitting: Cardiovascular Disease

## 2016-07-29 ENCOUNTER — Ambulatory Visit (INDEPENDENT_AMBULATORY_CARE_PROVIDER_SITE_OTHER): Payer: Managed Care, Other (non HMO) | Admitting: *Deleted

## 2016-07-29 ENCOUNTER — Encounter (INDEPENDENT_AMBULATORY_CARE_PROVIDER_SITE_OTHER): Payer: Self-pay

## 2016-07-29 DIAGNOSIS — Z5181 Encounter for therapeutic drug level monitoring: Secondary | ICD-10-CM

## 2016-07-29 DIAGNOSIS — I4891 Unspecified atrial fibrillation: Secondary | ICD-10-CM

## 2016-07-29 DIAGNOSIS — Z7901 Long term (current) use of anticoagulants: Secondary | ICD-10-CM | POA: Diagnosis not present

## 2016-07-29 DIAGNOSIS — Z8679 Personal history of other diseases of the circulatory system: Secondary | ICD-10-CM | POA: Diagnosis not present

## 2016-07-29 LAB — POCT INR: INR: 1.9

## 2016-08-19 ENCOUNTER — Ambulatory Visit (HOSPITAL_BASED_OUTPATIENT_CLINIC_OR_DEPARTMENT_OTHER): Admit: 2016-08-19 | Payer: Managed Care, Other (non HMO) | Admitting: Orthopedic Surgery

## 2016-08-19 ENCOUNTER — Encounter (HOSPITAL_BASED_OUTPATIENT_CLINIC_OR_DEPARTMENT_OTHER): Payer: Self-pay

## 2016-08-19 SURGERY — RELEASE, A1 PULLEY, FOR TRIGGER FINGER
Anesthesia: Choice | Laterality: Right

## 2016-08-25 ENCOUNTER — Other Ambulatory Visit: Payer: Self-pay | Admitting: Cardiovascular Disease

## 2016-09-02 ENCOUNTER — Ambulatory Visit (INDEPENDENT_AMBULATORY_CARE_PROVIDER_SITE_OTHER): Payer: Managed Care, Other (non HMO)

## 2016-09-02 DIAGNOSIS — Z5181 Encounter for therapeutic drug level monitoring: Secondary | ICD-10-CM | POA: Diagnosis not present

## 2016-09-02 DIAGNOSIS — Z8679 Personal history of other diseases of the circulatory system: Secondary | ICD-10-CM | POA: Diagnosis not present

## 2016-09-02 DIAGNOSIS — Z7901 Long term (current) use of anticoagulants: Secondary | ICD-10-CM

## 2016-09-02 DIAGNOSIS — I4891 Unspecified atrial fibrillation: Secondary | ICD-10-CM

## 2016-09-02 LAB — POCT INR: INR: 2.4

## 2016-09-26 ENCOUNTER — Ambulatory Visit (HOSPITAL_COMMUNITY)
Admission: RE | Admit: 2016-09-26 | Discharge: 2016-09-26 | Disposition: A | Discharge status: Home / Self Care | Payer: Managed Care, Other (non HMO) | Source: Ambulatory Visit | Attending: Nurse Practitioner | Admitting: Nurse Practitioner

## 2016-09-26 ENCOUNTER — Encounter (HOSPITAL_COMMUNITY): Payer: Self-pay | Admitting: Nurse Practitioner

## 2016-09-26 VITALS — BP 106/64 | HR 81 | Ht 62.0 in | Wt 207.0 lb

## 2016-09-26 DIAGNOSIS — Z836 Family history of other diseases of the respiratory system: Secondary | ICD-10-CM | POA: Diagnosis not present

## 2016-09-26 DIAGNOSIS — Z882 Allergy status to sulfonamides status: Secondary | ICD-10-CM | POA: Diagnosis not present

## 2016-09-26 DIAGNOSIS — R0600 Dyspnea, unspecified: Secondary | ICD-10-CM | POA: Insufficient documentation

## 2016-09-26 DIAGNOSIS — I700 Atherosclerosis of aorta: Secondary | ICD-10-CM | POA: Diagnosis not present

## 2016-09-26 DIAGNOSIS — I517 Cardiomegaly: Secondary | ICD-10-CM | POA: Diagnosis not present

## 2016-09-26 DIAGNOSIS — G4730 Sleep apnea, unspecified: Secondary | ICD-10-CM | POA: Diagnosis not present

## 2016-09-26 DIAGNOSIS — Z85828 Personal history of other malignant neoplasm of skin: Secondary | ICD-10-CM | POA: Diagnosis not present

## 2016-09-26 DIAGNOSIS — E785 Hyperlipidemia, unspecified: Secondary | ICD-10-CM | POA: Diagnosis not present

## 2016-09-26 DIAGNOSIS — I4891 Unspecified atrial fibrillation: Secondary | ICD-10-CM | POA: Insufficient documentation

## 2016-09-26 DIAGNOSIS — F419 Anxiety disorder, unspecified: Secondary | ICD-10-CM | POA: Insufficient documentation

## 2016-09-26 DIAGNOSIS — Z8544 Personal history of malignant neoplasm of other female genital organs: Secondary | ICD-10-CM | POA: Insufficient documentation

## 2016-09-26 DIAGNOSIS — G43909 Migraine, unspecified, not intractable, without status migrainosus: Secondary | ICD-10-CM | POA: Insufficient documentation

## 2016-09-26 DIAGNOSIS — Z801 Family history of malignant neoplasm of trachea, bronchus and lung: Secondary | ICD-10-CM | POA: Diagnosis not present

## 2016-09-26 DIAGNOSIS — Z803 Family history of malignant neoplasm of breast: Secondary | ICD-10-CM | POA: Diagnosis not present

## 2016-09-26 DIAGNOSIS — Z9851 Tubal ligation status: Secondary | ICD-10-CM | POA: Insufficient documentation

## 2016-09-26 DIAGNOSIS — I482 Chronic atrial fibrillation: Secondary | ICD-10-CM

## 2016-09-26 DIAGNOSIS — I10 Essential (primary) hypertension: Secondary | ICD-10-CM | POA: Insufficient documentation

## 2016-09-26 DIAGNOSIS — F17210 Nicotine dependence, cigarettes, uncomplicated: Secondary | ICD-10-CM | POA: Insufficient documentation

## 2016-09-26 DIAGNOSIS — Z7901 Long term (current) use of anticoagulants: Secondary | ICD-10-CM | POA: Insufficient documentation

## 2016-09-26 DIAGNOSIS — I4821 Permanent atrial fibrillation: Secondary | ICD-10-CM

## 2016-09-26 NOTE — Progress Notes (Signed)
Primary Care Physician: Burnard Bunting, MD Referring Physician: Dr. Celesta Aver is a 66 y.o. female with a h/o long standing afib  for at least since 2010 in the [place] clinic for evaluation. Her brother has been seen in the afib clinic and he referred her. Pt  has started in the last 1-2 weeks having shortness of breath with exertion, weakness, lightheadedness. She thought this might be from her afib, but states she has never had symptoms with afib. EKG shows rate controlled afib in the 80's. She had a physical in July and labs were normal per pt,including a TSH.She also had some mid chest burning last pm which she contributed to indigestion. She does smoke 1/2 pack of cigarettes x 20+ years and wheezes at times. She has sleep apnea and uses CPAP but her machine is very old and doesn't work right. She feels that she will have to have repeat sleep test before she gets a new machine. Otherwise, denies any change in her meds or other general health.  Today, she denies symptoms of palpitations, chest pain, shortness of breath, orthopnea, PND, lower extremity edema, dizziness, presyncope, syncope, or neurologic sequela. The patient is tolerating medications without difficulties and is otherwise without complaint today.   Past Medical History:  Diagnosis Date  . Anxiety   . Atrial fibrillation ([place])   . Fracture of wrist, closed 08/2012   Left  . Hyperlipidemia   . Hypertension   . Migraine   . Skin cancer   . Sleep apnea    uses cpap  . 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 Wears dentures    top  . Wears glasses    Past Surgical History:  Procedure Laterality Date  . CARPAL TUNNEL RELEASE Left 01/19/2013   Procedure: LEFT CARPAL TUNNEL RELEASE;  Surgeon: Tennis Must, MD;  Location: [place];  Service: Orthopedics;  Laterality: Left;  . CESAREAN SECTION    . FOOT SURGERY  05/2010   Left  . OPEN REDUCTION INTERNAL  FIXATION (ORIF) DISTAL RADIAL FRACTURE Left 09/08/2012   Procedure: OPEN REDUCTION INTERNAL FIXATION (ORIF) DISTAL RADIAL FRACTURE;  Surgeon: Tennis Must, MD;  Location: [facility];  Service: Orthopedics;  Laterality: Left;  . POLYPECTOMY     endometrial polyp removed 2005 D&C  . Skin cancer removal     this past year and five years ago  . tubaligation      Current Outpatient Prescriptions  Medication Sig Dispense Refill  . atorvastatin (LIPITOR) 20 MG tablet Take 20 mg by mouth daily.     . metoprolol succinate (TOPROL-XL) 50 MG 24 hr tablet take 1 tablet by mouth once daily with food 30 tablet 11  . quinapril (ACCUPRIL) 20 MG tablet Take 20 mg by mouth daily.     . sertraline (ZOLOFT) 50 MG tablet Take 50 mg by mouth at bedtime.     . verapamil (CALAN-SR) 240 MG CR tablet take 1 tablet by mouth at bedtime (Patient taking differently: take 1 tablet by mouth) 90 tablet 2  . warfarin (COUMADIN) 5 MG tablet take tablets by mouth as directed BY COUMADIN CLINIC 35 tablet 3   No current facility-administered medications for this encounter.     Allergies  Allergen Reactions  . Sulfa Antibiotics Other (See Comments)    Unknown to sx, had allergy as a child  . Sulfonamide Derivatives Rash    Social History  Social History  . Marital status: Married    Spouse name: N/A  . Number of children: N/A  . Years of education: N/A   Occupational History  . customer service [employer]   Social History Main Topics  . Smoking status: Current Every Day Smoker    Years: 8.00    Types: Cigarettes  . Smokeless tobacco: Never Used     Comment: 3 a day  . Alcohol use No  . Drug use: No  . Sexual activity: Not Currently    Partners: Male    Birth control/ protection: Surgical, Post-menopausal     Comment: tubal   Other Topics Concern  . Not on file   Social History Narrative  . No narrative on file    Family History  Problem Relation Age of Onset  . Cancer Mother         lung  . Cancer Father        lung  . COPD Sister   . Emphysema Sister   . Cancer Paternal Grandmother        breast  . Colon cancer Neg Hx   . Esophageal cancer Neg Hx   . Rectal cancer Neg Hx   . Stomach cancer Neg Hx     ROS- All systems are reviewed and negative except as per the HPI above  Physical Exam: Vitals:   09/26/16 1519  BP: 106/64  Pulse: 81  Weight: 207 lb (93.9 kg)  Height: 5\' 2"  (1.575 m)   Wt Readings from Last 3 Encounters:  09/26/16 207 lb (93.9 kg)  06/19/16 203 lb 6.4 oz (92.3 kg)  06/22/15 209 lb (94.8 kg)    Labs: Lab Results  Component Value Date   NA 137 05/30/2014   K 4.0 05/30/2014   CL 105 05/30/2014   CO2 27 05/30/2014   GLUCOSE 76 05/30/2014   BUN 16 05/30/2014   CREATININE 1.06 05/30/2014   CALCIUM 9.3 05/30/2014   Lab Results  Component Value Date   INR 2.4 09/02/2016   Lab Results  Component Value Date   CHOL 192 04/06/2008   HDL 37.1 (L) 04/06/2008   LDLCALC 125 (H) 04/06/2008   TRIG 151 (H) 04/06/2008     GEN- The patient is well appearing, alert and oriented x 3 today.   Head- normocephalic, atraumatic Eyes-  Sclera clear, conjunctiva pink Ears- hearing intact Oropharynx- clear Neck- supple, no JVP Lymph- no cervical lymphadenopathy Lungs- Clear to ausculation bilaterally, normal work of breathing Heart- irregular rate and rhythm, no murmurs, rubs or gallops, PMI not laterally displaced GI- soft, NT, ND, + BS Extremities- no clubbing, cyanosis, or edema MS- no significant deformity or atrophy Skin- no rash or lesion Psych- euthymic mood, full affect Neuro- strength and sensation are intact  EKG- afib rate controlled at 81 bpm, qrs int 60 ms, qtc 427 ms, cannot r/o old anterior infarct Epic records reviewed   Assessment and Plan: 1. Permanent longstanding afib Pt has always been asymptomatic with rate controlled afib so I am concerned her recent symptoms are related to something else Will order  echo, lexi myoview, CXR (h/o of tobacco use)  Recent labs normal per pt with pcp in July Encouraged to f/u with sleep test to get new sleep apnea machine If above tests are normal, may need pulmonary w/u with tobacco use,wheezing, dyspnea, prior mildly elevated pulmonary pressures on echo 2015  Based on results of above, may need sooner f/u with Dr. Orlan Leavens  Eulah Pont, [facility] [address] [place], [place] [address] [phone]

## 2016-09-27 ENCOUNTER — Telehealth (HOSPITAL_COMMUNITY): Payer: Self-pay | Admitting: Nurse Practitioner

## 2016-09-30 ENCOUNTER — Encounter: Payer: Self-pay | Admitting: Gastroenterology

## 2016-10-01 NOTE — Telephone Encounter (Signed)
User: Cherie Dark A Date/time: 09/27/16 9:40 AM  Comment: Called pt and lmsg for her to CB to sch echo and myoview.   Context:  Outcome: Left Message  Phone number: [phone] Phone Type: Mobile  Comm. type: Telephone Call type: Outgoing  Contact: Rober Minion C Relation to patient: Self

## 2016-10-06 ENCOUNTER — Other Ambulatory Visit: Payer: Self-pay | Admitting: Cardiovascular Disease

## 2016-10-10 ENCOUNTER — Telehealth (HOSPITAL_COMMUNITY): Payer: Self-pay | Admitting: *Deleted

## 2016-10-10 NOTE — Telephone Encounter (Signed)
Left message on voicemail per DPR in reference to upcoming appointment scheduled on 10/15/16 with detailed instructions given per Myocardial Perfusion Study Information Sheet for the test. LM to arrive 15 minutes early, and that it is imperative to arrive on time for appointment to keep from having the test rescheduled. If you need to cancel or reschedule your appointment, please call the office within 24 hours of your appointment. Failure to do so may result in a cancellation of your appointment, and a $50 no show fee. Phone number given for call back for any questions. Kirstie Peri

## 2016-10-15 ENCOUNTER — Ambulatory Visit (INDEPENDENT_AMBULATORY_CARE_PROVIDER_SITE_OTHER): Payer: Managed Care, Other (non HMO) | Admitting: *Deleted

## 2016-10-15 ENCOUNTER — Ambulatory Visit (HOSPITAL_BASED_OUTPATIENT_CLINIC_OR_DEPARTMENT_OTHER): Payer: Managed Care, Other (non HMO)

## 2016-10-15 ENCOUNTER — Other Ambulatory Visit: Payer: Self-pay

## 2016-10-15 ENCOUNTER — Ambulatory Visit (HOSPITAL_COMMUNITY): Payer: Managed Care, Other (non HMO) | Attending: Cardiovascular Disease

## 2016-10-15 DIAGNOSIS — I071 Rheumatic tricuspid insufficiency: Secondary | ICD-10-CM | POA: Diagnosis not present

## 2016-10-15 DIAGNOSIS — Z5181 Encounter for therapeutic drug level monitoring: Secondary | ICD-10-CM

## 2016-10-15 DIAGNOSIS — G4733 Obstructive sleep apnea (adult) (pediatric): Secondary | ICD-10-CM | POA: Insufficient documentation

## 2016-10-15 DIAGNOSIS — I482 Chronic atrial fibrillation: Secondary | ICD-10-CM

## 2016-10-15 DIAGNOSIS — E785 Hyperlipidemia, unspecified: Secondary | ICD-10-CM | POA: Diagnosis not present

## 2016-10-15 DIAGNOSIS — I119 Hypertensive heart disease without heart failure: Secondary | ICD-10-CM | POA: Diagnosis not present

## 2016-10-15 DIAGNOSIS — R0600 Dyspnea, unspecified: Secondary | ICD-10-CM

## 2016-10-15 DIAGNOSIS — I4821 Permanent atrial fibrillation: Secondary | ICD-10-CM

## 2016-10-15 DIAGNOSIS — F17210 Nicotine dependence, cigarettes, uncomplicated: Secondary | ICD-10-CM | POA: Insufficient documentation

## 2016-10-15 LAB — MYOCARDIAL PERFUSION IMAGING
CHL CUP NUCLEAR SRS: 9
CHL CUP NUCLEAR SSS: 12
LHR: 0.34
LV sys vol: 15 mL
LVDIAVOL: 56 mL (ref 46–106)
Peak HR: 111 {beats}/min
Rest HR: 90 {beats}/min
SDS: 3
TID: 0.9

## 2016-10-15 LAB — POCT INR: INR: 2.1

## 2016-10-15 MED ORDER — PERFLUTREN LIPID MICROSPHERE
1.0000 mL | INTRAVENOUS | Status: AC | PRN
  Administered 2016-10-15: 2 mL via INTRAVENOUS

## 2016-10-15 MED ORDER — TECHNETIUM TC 99M TETROFOSMIN IV KIT
10.2000 | PACK | Freq: Once | INTRAVENOUS | Status: AC | PRN
  Administered 2016-10-15: 10.2 via INTRAVENOUS
  Filled 2016-10-15: qty 11

## 2016-10-15 MED ORDER — REGADENOSON 0.4 MG/5ML IV SOLN
0.4000 mg | Freq: Once | INTRAVENOUS | Status: AC
  Administered 2016-10-15: 0.4 mg via INTRAVENOUS

## 2016-10-15 MED ORDER — TECHNETIUM TC 99M TETROFOSMIN IV KIT
32.1000 | PACK | Freq: Once | INTRAVENOUS | Status: AC | PRN
  Administered 2016-10-15: 32.1 via INTRAVENOUS
  Filled 2016-10-15: qty 33

## 2016-10-16 ENCOUNTER — Telehealth (HOSPITAL_COMMUNITY): Payer: Self-pay | Admitting: *Deleted

## 2016-10-16 ENCOUNTER — Other Ambulatory Visit (HOSPITAL_COMMUNITY): Payer: Self-pay | Admitting: *Deleted

## 2016-10-16 DIAGNOSIS — R0602 Shortness of breath: Secondary | ICD-10-CM

## 2016-10-16 NOTE — Telephone Encounter (Signed)
Pt given appt for PFT 10/25/2016 at 1 pm.  Given instructions, no smoking, no caffeine, no inhalers 4 hours prior to appt.

## 2016-10-25 ENCOUNTER — Ambulatory Visit (HOSPITAL_COMMUNITY)
Admission: RE | Admit: 2016-10-25 | Discharge: 2016-10-25 | Disposition: A | Discharge status: Home / Self Care | Payer: Managed Care, Other (non HMO) | Source: Ambulatory Visit | Attending: Nurse Practitioner | Admitting: Nurse Practitioner

## 2016-10-25 ENCOUNTER — Encounter (HOSPITAL_COMMUNITY): Payer: Managed Care, Other (non HMO)

## 2016-10-25 DIAGNOSIS — R0602 Shortness of breath: Secondary | ICD-10-CM | POA: Insufficient documentation

## 2016-10-25 LAB — PULMONARY FUNCTION TEST
DL/VA % PRED: 89 %
DL/VA: 4.08 ml/min/mmHg/L
DLCO UNC: 16.83 ml/min/mmHg
DLCO unc % pred: 78 %
FEF 25-75 Post: 2.88 L/sec
FEF 25-75 Pre: 1.9 L/sec
FEF2575-%Change-Post: 51 %
FEF2575-%PRED-POST: 139 %
FEF2575-%Pred-Pre: 92 %
FEV1-%CHANGE-POST: 11 %
FEV1-%PRED-PRE: 89 %
FEV1-%Pred-Post: 99 %
FEV1-Post: 2.25 L
FEV1-Pre: 2.02 L
FEV1FVC-%Change-Post: 0 %
FEV1FVC-%PRED-PRE: 103 %
FEV6-%Change-Post: 10 %
FEV6-%PRED-POST: 99 %
FEV6-%Pred-Pre: 89 %
FEV6-Post: 2.81 L
FEV6-Pre: 2.54 L
FEV6FVC-%CHANGE-POST: 0 %
FEV6FVC-%Pred-Post: 104 %
FEV6FVC-%Pred-Pre: 103 %
FVC-%Change-Post: 10 %
FVC-%PRED-POST: 95 %
FVC-%PRED-PRE: 86 %
FVC-Post: 2.81 L
FVC-Pre: 2.54 L
POST FEV1/FVC RATIO: 80 %
PRE FEV6/FVC RATIO: 100 %
Post FEV6/FVC ratio: 100 %
Pre FEV1/FVC ratio: 80 %
RV % pred: 102 %
RV: 2.03 L
TLC % pred: 100 %
TLC: 4.77 L

## 2016-10-25 MED ORDER — ALBUTEROL SULFATE (2.5 MG/3ML) 0.083% IN NEBU
2.5000 mg | INHALATION_SOLUTION | Freq: Once | RESPIRATORY_TRACT | Status: AC
  Administered 2016-10-25: 2.5 mg via RESPIRATORY_TRACT

## 2016-11-26 ENCOUNTER — Ambulatory Visit (INDEPENDENT_AMBULATORY_CARE_PROVIDER_SITE_OTHER): Payer: Managed Care, Other (non HMO) | Admitting: *Deleted

## 2016-11-26 ENCOUNTER — Ambulatory Visit (INDEPENDENT_AMBULATORY_CARE_PROVIDER_SITE_OTHER): Payer: Managed Care, Other (non HMO) | Admitting: Gastroenterology

## 2016-11-26 ENCOUNTER — Encounter: Payer: Self-pay | Admitting: Gastroenterology

## 2016-11-26 ENCOUNTER — Other Ambulatory Visit (INDEPENDENT_AMBULATORY_CARE_PROVIDER_SITE_OTHER): Payer: Managed Care, Other (non HMO)

## 2016-11-26 VITALS — BP 100/70 | HR 68 | Ht 62.0 in | Wt 209.1 lb

## 2016-11-26 DIAGNOSIS — K625 Hemorrhage of anus and rectum: Secondary | ICD-10-CM | POA: Diagnosis not present

## 2016-11-26 DIAGNOSIS — I482 Chronic atrial fibrillation: Secondary | ICD-10-CM | POA: Diagnosis not present

## 2016-11-26 DIAGNOSIS — Z5181 Encounter for therapeutic drug level monitoring: Secondary | ICD-10-CM

## 2016-11-26 DIAGNOSIS — I4821 Permanent atrial fibrillation: Secondary | ICD-10-CM

## 2016-11-26 LAB — CBC WITH DIFFERENTIAL/PLATELET
Basophils Absolute: 0.1 10*3/uL (ref 0.0–0.1)
Basophils Relative: 1.5 % (ref 0.0–3.0)
EOS PCT: 2.4 % (ref 0.0–5.0)
Eosinophils Absolute: 0.2 10*3/uL (ref 0.0–0.7)
HEMATOCRIT: 46.1 % — AB (ref 36.0–46.0)
HEMOGLOBIN: 15.3 g/dL — AB (ref 12.0–15.0)
LYMPHS ABS: 2.2 10*3/uL (ref 0.7–4.0)
Lymphocytes Relative: 30 % (ref 12.0–46.0)
MCHC: 33.3 g/dL (ref 30.0–36.0)
MCV: 97.4 fl (ref 78.0–100.0)
MONO ABS: 0.6 10*3/uL (ref 0.1–1.0)
Monocytes Relative: 8.6 % (ref 3.0–12.0)
Neutro Abs: 4.2 10*3/uL (ref 1.4–7.7)
Neutrophils Relative %: 57.5 % (ref 43.0–77.0)
PLATELETS: 210 10*3/uL (ref 150.0–400.0)
RBC: 4.73 Mil/uL (ref 3.87–5.11)
RDW: 13.2 % (ref 11.5–15.5)
WBC: 7.4 10*3/uL (ref 4.0–10.5)

## 2016-11-26 LAB — POCT INR: INR: 2.2

## 2016-11-26 MED ORDER — NA SULFATE-K SULFATE-MG SULF 17.5-3.13-1.6 GM/177ML PO SOLN: 1.0000 | Freq: Once | ORAL | 0 refills | Status: AC

## 2016-11-26 NOTE — Progress Notes (Signed)
Review of pertinent gastrointestinal problems: 1. Precancerous colon polyps: Colonoscopy Dr. Sharlett Iles 04/2007: "15 polyps" path TAs, SSAs, HP.  Colonoscopy Dr. Sharlett Iles 05/2008: one polyp (no size given), was SSP.  Colonoscopy Dr. Sharlett Iles 06/2011 "very spastic colon! Multiple polyps, greater than 3, removed with snare/cautery" no sizes given, SSP and HP on path.  HPI: This is a  very pleasant 66 year old woman who was self-referred rectal bleeding  Chief complaint is rectal bleeding  Rectal bleeding, can be intermittent with BMs over about a 2-3 weeks span.  Bright red interimttently for about 2-3 weeks, then stopped.  Occurs 4-5 times per year.  NO associated anal discomfort. Only occurs with a BM.  Will get weak during those times. This bleeding has been going on for years intermittently. She never has anal pain.   previous colonoscopy with Dr. Sharlett Iles describes some large hemorrhoids, anal piles   She is on coumadin for many years, for a fib.  Overall weight has been stable maybe a bit higher lately.  Feels bloated a lot. No nausea or vomiting.    Review of systems: Pertinent positive and negative review of systems were noted in the above HPI section. All other review negative.   Past Medical History:  Diagnosis Date  . Anxiety   . Atrial fibrillation ([place])   . Colon polyps   . Depression   . Diverticulosis   . Fracture of wrist, closed 08/2012   Left  . Hyperlipidemia   . Hypertension   . Migraine   . Skin cancer   . Sleep apnea    uses cpap  . 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 Wears dentures    top  . Wears glasses     Past Surgical History:  Procedure Laterality Date  . BUNIONECTOMY  05/2010   Left  . CARPAL TUNNEL RELEASE Left 01/19/2013   Procedure: LEFT CARPAL TUNNEL RELEASE;  Surgeon: Tennis Must, MD;  Location: [facility];  Service: Orthopedics;  Laterality: Left;  . CESAREAN SECTION    . DILATION  AND CURETTAGE OF UTERUS  2005   endometrial polyp removed   . OPEN REDUCTION INTERNAL FIXATION (ORIF) DISTAL RADIAL FRACTURE Left 09/08/2012   Procedure: OPEN REDUCTION INTERNAL FIXATION (ORIF) DISTAL RADIAL FRACTURE;  Surgeon: Tennis Must, MD;  Location: [facility];  Service: Orthopedics;  Laterality: Left;  . SKIN CANCER EXCISION    . TUBAL LIGATION      Current Outpatient Prescriptions  Medication Sig Dispense Refill  . atorvastatin (LIPITOR) 20 MG tablet Take 20 mg by mouth daily.     . metoprolol succinate (TOPROL-XL) 50 MG 24 hr tablet take 1 tablet by mouth once daily with food 30 tablet 11  . quinapril (ACCUPRIL) 20 MG tablet Take 20 mg by mouth daily.     . sertraline (ZOLOFT) 50 MG tablet Take 50 mg by mouth at bedtime.     . verapamil (CALAN-SR) 240 MG CR tablet take 1 tablet by mouth at bedtime 90 tablet 2  . warfarin (COUMADIN) 5 MG tablet Take as directed by Coumadin Clinic 35 tablet 3   No current facility-administered medications for this visit.     Allergies as of 11/26/2016 - Review Complete 11/26/2016  Allergen Reaction Noted  . Sulfa antibiotics Other (See Comments) 05/30/2014  . Sulfonamide derivatives Rash 04/05/2008    Family History  Problem Relation Age of Onset  . Lung cancer Mother   .  Lung cancer Father   . COPD Sister   . Emphysema Sister   . Breast cancer Paternal Grandmother   . Colon cancer Neg Hx   . Esophageal cancer Neg Hx   . Rectal cancer Neg Hx   . Stomach cancer Neg Hx     Social History   Social History  . Marital status: Married    Spouse name: N/A  . Number of children: 1  . Years of education: N/A   Occupational History  . customer service [employer]   Social History Main Topics  . Smoking status: Current Every Day Smoker    Years: 8.00    Types: Cigarettes  . Smokeless tobacco: Never Used     Comment: 3 a day  . Alcohol use 0.0 oz/week     Comment: occasional  . Drug use: No  . Sexual activity:  Not Currently    Partners: Male    Birth control/ protection: Surgical, Post-menopausal     Comment: tubal   Other Topics Concern  . Not on file   Social History Narrative  . No narrative on file     Physical Exam: Ht 5\' 2"  (1.575 m) Comment: height measured without shoes  Wt 209 lb 2 oz (94.9 kg)   LMP 10/27/2006   BMI 38.25 kg/m  Constitutional: generally well-appearing Psychiatric: alert and oriented x3 Eyes: extraocular movements intact Mouth: oral pharynx moist, no lesions Neck: supple no lymphadenopathy Cardiovascular: heart regular rate and rhythm Lungs: clear to auscultation bilaterally Abdomen: soft, nontender, nondistended, no obvious ascites, no peritoneal signs, normal bowel sounds Extremities: no lower extremity edema bilaterally Skin: no lesions on visible extremities  rectal exam deferred for upcoming colonoscopy   Assessment and plan: 66 y.o. female with intermittent rectal bleeding, sounds benign and anal rectal in origin. Chronic Coumadin use likely exacerbates amount of blood that she sees. I recommended we proceed with colonoscopy since she has had precancerous polyps previously. She says the most recent episode of rectal bleeding was the worst in years, lasted 2 or 3 weeks, she started feeling weak toward the end of it. She has not seen any blood at all in 2 or 3 weeks fortunately. I will get a CBC to see if she is anemic today. She will need  to hold her Coumadin for 5 days and we will communicate with her cardiologist for safety of that recommendation.   Please see the "Patient Instructions" section for addition details about the plan.   Owens Loffler, MD [facility] 11/26/2016, 2:19 PM  Cc: Burnard Bunting, MD

## 2016-11-26 NOTE — Patient Instructions (Addendum)
You will be set up for a colonoscopy for rectal bleeding, personal history of precancerous polyps.  We will communicate with your cardiologist about the safety of holding your coumadin for 5 days prior to the procedure.  You will have labs checked today in the basement lab.  Please head down after you check out with the front desk  (cbc).  Normal BMI (Body Mass Index- based on height and weight) is between 19 and 25. Your BMI today is Body mass index is 38.25 kg/m. Linda Ruiz Please consider follow up  regarding your BMI with your Primary Care Provider.

## 2016-11-27 ENCOUNTER — Telehealth: Payer: Self-pay | Admitting: Pharmacist Clinician (PhC)/ Clinical Pharmacy Specialist

## 2016-11-27 NOTE — Telephone Encounter (Signed)
Patient with diagnosis of atrial fibrillation on warfarin for anticoagulation.    Procedure: endoscopy Date of procedure: Nov 5  CHADS2 score of 1  (HTN);  CHADS2-VASc score of  2 (HTN, female)  CrCl - no labs available in 2018 Platelet count 210  Per office protocol, patient can hold warfarin for 5 days prior to procedure.   Patient will not need bridging with Lovenox (enoxaparin) around procedure.  Patient should restart warfarin on the evening of procedure or day after, at discretion of procedure MD

## 2016-11-27 NOTE — Telephone Encounter (Signed)
The pt has been notified of the warfarin hold, she verbalized understanding.

## 2016-12-16 ENCOUNTER — Encounter: Payer: Self-pay | Admitting: Gastroenterology

## 2016-12-17 ENCOUNTER — Telehealth: Payer: Self-pay

## 2016-12-17 NOTE — Telephone Encounter (Signed)
-----   Message from Jeoffrey Massed, RN sent at 12/09/2016 10:01 AM EDT -----   ----- Message ----- From: Jeoffrey Massed, RN Sent: 12/09/2016 To: Jeoffrey Massed, RN    ----- Message ----- From: Jeoffrey Massed, RN Sent: 12/06/2016 To: Jeoffrey Massed, RN   11/26/2016   RE: Linda Ruiz DOB: 1950/04/05 MRN: [id number]   Dear Tommy Medal,    We have scheduled the above patient for an endoscopic procedure with Dr Ardis Hughs. Our records show that she is on anticoagulation therapy.   Please advise as to how long the patient may come off her therapy of Coumadin prior to the procedure, which is scheduled for 12/30/16.  Please fax back/ or route  to Wallie Lagrand at [phone].   Sincerely,    Jeoffrey Massed RN

## 2016-12-24 NOTE — Telephone Encounter (Signed)
The pt has been notified of the warfarin hold, she verbalized understanding.

## 2016-12-30 ENCOUNTER — Encounter: Payer: Self-pay | Admitting: Gastroenterology

## 2016-12-30 ENCOUNTER — Ambulatory Visit (AMBULATORY_SURGERY_CENTER): Payer: Managed Care, Other (non HMO) | Admitting: Gastroenterology

## 2016-12-30 VITALS — BP 167/91 | HR 90 | Temp 98.0°F | Resp 15 | Ht 62.0 in | Wt 209.0 lb

## 2016-12-30 DIAGNOSIS — K649 Unspecified hemorrhoids: Secondary | ICD-10-CM | POA: Diagnosis not present

## 2016-12-30 DIAGNOSIS — D125 Benign neoplasm of sigmoid colon: Secondary | ICD-10-CM

## 2016-12-30 DIAGNOSIS — D124 Benign neoplasm of descending colon: Secondary | ICD-10-CM | POA: Diagnosis not present

## 2016-12-30 DIAGNOSIS — K625 Hemorrhage of anus and rectum: Secondary | ICD-10-CM

## 2016-12-30 MED ORDER — SODIUM CHLORIDE 0.9 % IV SOLN: 500.0000 mL | INTRAVENOUS | Status: DC

## 2016-12-30 NOTE — Op Note (Signed)
[facility] Patient Name: Linda Ruiz Procedure Date: 12/30/2016 2:25 PM MRN: [id number] Endoscopist: Milus Banister , MD Age: 66 Referring MD:  Date of Birth: [birth year] Gender: Female Account #: [id number] Procedure:                Colonoscopy Indications:              High risk colon cancer surveillance: Personal                            history of colonic polyps; Precancerous colon                            polyps: Colonoscopy Dr. Sharlett Iles 04/2007: "15                            polyps" path TAs, SSAs, HP. Colonoscopy Dr.                            Sharlett Iles 05/2008:one polyp (no size given), was                            SSP. Colonoscopy Dr. Sharlett Iles 06/2011"very                            spastic colon! Multiple polyps, greater than 3,                            removed with snare/cautery" no sizes given, SSP and                            HP on path. Medicines:                Monitored Anesthesia Care Procedure:                Pre-Anesthesia Assessment:                           - Prior to the procedure, a History and Physical                            was performed, and patient medications and                            allergies were reviewed. The patient's tolerance of                            previous anesthesia was also reviewed. The risks                            and benefits of the procedure and the sedation                            options and risks were discussed with the patient.  All questions were answered, and informed consent                            was obtained. Prior Anticoagulants: The patient has                            taken Coumadin (warfarin), last dose was 5 days                            prior to procedure. ASA Grade Assessment: III - A                            patient with severe systemic disease. After                            reviewing the risks and benefits, the patient was                             deemed in satisfactory condition to undergo the                            procedure.                           After obtaining informed consent, the colonoscope                            was passed under direct vision. Throughout the                            procedure, the patient's blood pressure, pulse, and                            oxygen saturations were monitored continuously. The                            Colonoscope was introduced through the anus and                            advanced to the the cecum, identified by                            appendiceal orifice and ileocecal valve. The                            colonoscopy was performed without difficulty. The                            patient tolerated the procedure well. The ileocecal                            valve, appendiceal orifice, and rectum were  photographed. The quality of the bowel preparation                            was good. Scope In: 2:29:30 PM Scope Out: 2:44:26 PM Scope Withdrawal Time: 0 hours 11 minutes 58 seconds  Total Procedure Duration: 0 hours 14 minutes 56 seconds  Findings:                 Six sessile polyps were found in the sigmoid colon                            and descending colon. The polyps were 3 to 5 mm in                            size. These polyps were removed with a cold snare.                            Resection and retrieval were complete.                           External and internal hemorrhoids were found. The                            hemorrhoids were medium-sized.                           The exam was otherwise without abnormality on                            direct and retroflexion views. Complications:            No immediate complications. Estimated blood loss:                            None. Estimated Blood Loss:     Estimated blood loss: none. Impression:               - Six 3 to 5 mm polyps in the sigmoid  colon and in                            the descending colon, removed with a cold snare.                            Resected and retrieved.                           - External and internal hemorrhoids.                           - The examination was otherwise normal on direct                            and retroflexion views. Recommendation:           - Patient has a contact number available for  emergencies. The signs and symptoms of potential                            delayed complications were discussed with the                            patient. Return to normal activities tomorrow.                            Written discharge instructions were provided to the                            patient.                           - Resume previous diet.                           - Continue present medications.                           - Repeat colonoscopy is recommended. The                            colonoscopy date will be determined after pathology                            results from today's exam become available for                            review.                           -                           You will receive a letter within 2-3 weeks with the                            pathology results and my final recommendations.                           If the polyp(s) is proven to be 'pre-cancerous' on                            pathology, you will need repeat colonoscopy in 3-5                            years. If the polyp(s) is NOT 'precancerous' on                            pathology then you should repeat colon cancer                            screening in 10 years with colonoscopy without need  for colon cancer screening by any method prior to                            then (including stool testing). Milus Banister, MD 12/30/2016 2:47:37 PM This report has been signed electronically.

## 2016-12-30 NOTE — Patient Instructions (Signed)
YOU HAD AN ENDOSCOPIC PROCEDURE TODAY AT [facility]:   Refer to the procedure report that was given to you for any specific questions about what was found during the examination.  If the procedure report does not answer your questions, please call your gastroenterologist to clarify.  If you requested that your care partner not be given the details of your procedure findings, then the procedure report has been included in a sealed envelope for you to review at your convenience later.  YOU SHOULD EXPECT: Some feelings of bloating in the abdomen. Passage of more gas than usual.  Walking can help get rid of the air that was put into your GI tract during the procedure and reduce the bloating. If you had a lower endoscopy (such as a colonoscopy or flexible sigmoidoscopy) you may notice spotting of blood in your stool or on the toilet paper. If you underwent a bowel prep for your procedure, you may not have a normal bowel movement for a few days.  Please Note:  You might notice some irritation and congestion in your nose or some drainage.  This is from the oxygen used during your procedure.  There is no need for concern and it should clear up in a day or so.  SYMPTOMS TO REPORT IMMEDIATELY:   Following lower endoscopy (colonoscopy or flexible sigmoidoscopy):  Excessive amounts of blood in the stool  Significant tenderness or worsening of abdominal pains  Swelling of the abdomen that is new, acute  Fever of 100F or higher  For urgent or emergent issues, a gastroenterologist can be reached at any hour by calling [phone].   DIET:  We do recommend a small meal at first, but then you may proceed to your regular diet.  Drink plenty of fluids but you should avoid alcoholic beverages for 24 hours.  ACTIVITY:  You should plan to take it easy for the rest of today and you should NOT DRIVE or use heavy machinery until tomorrow (because of the sedation medicines used during the test).     FOLLOW UP: Our staff will call the number listed on your records the next business day following your procedure to check on you and address any questions or concerns that you may have regarding the information given to you following your procedure. If we do not reach you, we will leave a message.  However, if you are feeling well and you are not experiencing any problems, there is no need to return our call.  We will assume that you have returned to your regular daily activities without incident.  If any biopsies were taken you will be contacted by phone or by letter within the next 1-3 weeks.  Please call us at [phone] if you have not heard about the biopsies in 3 weeks.   Await for biopsy results to determine next repeat Colonoscopy screening Polyps (handout given) Hemorrhoids (handout given) Return of you  coumadin today    SIGNATURES/CONFIDENTIALITY: You and/or your care partner have signed paperwork which will be entered into your electronic medical record.  These signatures attest to the fact that that the information above on your After Visit Summary has been reviewed and is understood.  Full responsibility of the confidentiality of this discharge information lies with you and/or your care-partner.

## 2016-12-30 NOTE — Progress Notes (Signed)
Report to PACU, RN, vss, BBS= Clear.  

## 2016-12-30 NOTE — Progress Notes (Signed)
Called to room to assist during endoscopic procedure.  Patient ID and intended procedure confirmed with present staff. Received instructions for my participation in the procedure from the performing physician.  

## 2016-12-31 ENCOUNTER — Telehealth: Payer: Self-pay | Admitting: *Deleted

## 2016-12-31 NOTE — Telephone Encounter (Signed)
  Follow up Call-  Call back number 12/30/2016  Post procedure Call Back phone  # [phone]  Permission to leave phone message Yes  Some recent data might be hidden     Patient questions:  Do you have a fever, pain , or abdominal swelling? No. Pain Score  0 *  Have you tolerated food without any problems? Yes.    Have you been able to return to your normal activities? Yes.    Do you have any questions about your discharge instructions: Diet   No. Medications  No. Follow up visit  No.  Do you have questions or concerns about your Care? No.  Actions: * If pain score is 4 or above: No action needed, pain <4.

## 2017-01-03 ENCOUNTER — Encounter: Payer: Self-pay | Admitting: Gastroenterology

## 2017-01-07 ENCOUNTER — Ambulatory Visit (INDEPENDENT_AMBULATORY_CARE_PROVIDER_SITE_OTHER): Payer: Managed Care, Other (non HMO) | Admitting: *Deleted

## 2017-01-07 DIAGNOSIS — Z5181 Encounter for therapeutic drug level monitoring: Secondary | ICD-10-CM

## 2017-01-07 DIAGNOSIS — I482 Chronic atrial fibrillation: Secondary | ICD-10-CM

## 2017-01-07 DIAGNOSIS — I4821 Permanent atrial fibrillation: Secondary | ICD-10-CM

## 2017-01-07 LAB — POCT INR: INR: 1.6

## 2017-01-07 NOTE — Patient Instructions (Signed)
Today and tomorrow take 1.5 tablets, then Continue on same dosage of 1 tablet daily except 1/2 tablet on Fridays. Recheck INR in 2 weeks.  Coumadin Clinic [phone]  Main [phone]

## 2017-01-22 ENCOUNTER — Ambulatory Visit (INDEPENDENT_AMBULATORY_CARE_PROVIDER_SITE_OTHER): Payer: Managed Care, Other (non HMO)

## 2017-01-22 DIAGNOSIS — Z5181 Encounter for therapeutic drug level monitoring: Secondary | ICD-10-CM | POA: Diagnosis not present

## 2017-01-22 DIAGNOSIS — I482 Chronic atrial fibrillation: Secondary | ICD-10-CM

## 2017-01-22 DIAGNOSIS — I4821 Permanent atrial fibrillation: Secondary | ICD-10-CM

## 2017-01-22 LAB — POCT INR: INR: 2.8

## 2017-01-22 NOTE — Patient Instructions (Signed)
Continue on same dosage of 1 tablet daily except 1/2 tablet on Fridays. Recheck INR in 3 weeks.  Coumadin Clinic [phone]  Main [phone]

## 2017-02-28 ENCOUNTER — Ambulatory Visit (INDEPENDENT_AMBULATORY_CARE_PROVIDER_SITE_OTHER): Payer: Managed Care, Other (non HMO) | Admitting: *Deleted

## 2017-02-28 DIAGNOSIS — I482 Chronic atrial fibrillation: Secondary | ICD-10-CM

## 2017-02-28 DIAGNOSIS — I4821 Permanent atrial fibrillation: Secondary | ICD-10-CM

## 2017-02-28 DIAGNOSIS — Z5181 Encounter for therapeutic drug level monitoring: Secondary | ICD-10-CM

## 2017-02-28 LAB — POCT INR: INR: 2.6

## 2017-02-28 NOTE — Patient Instructions (Signed)
Description   Continue on same dosage of 1 tablet daily except 1/2 tablet on Fridays. Recheck INR in 4 weeks.  Coumadin Clinic [phone]  Main [phone]

## 2017-03-06 ENCOUNTER — Other Ambulatory Visit: Payer: Self-pay | Admitting: *Deleted

## 2017-03-06 MED ORDER — WARFARIN SODIUM 5 MG PO TABS: ORAL_TABLET | ORAL | 1 refills | Status: DC

## 2017-03-28 ENCOUNTER — Ambulatory Visit (INDEPENDENT_AMBULATORY_CARE_PROVIDER_SITE_OTHER): Payer: Managed Care, Other (non HMO) | Admitting: Pharmacist

## 2017-03-28 DIAGNOSIS — I482 Chronic atrial fibrillation: Secondary | ICD-10-CM | POA: Diagnosis not present

## 2017-03-28 DIAGNOSIS — Z5181 Encounter for therapeutic drug level monitoring: Secondary | ICD-10-CM | POA: Diagnosis not present

## 2017-03-28 DIAGNOSIS — I4821 Permanent atrial fibrillation: Secondary | ICD-10-CM

## 2017-03-28 LAB — POCT INR: INR: 2.1

## 2017-03-28 NOTE — Patient Instructions (Signed)
Description   Continue on same dosage of 1 tablet daily except 1/2 tablet on Fridays. Recheck INR in 6 weeks.  Coumadin Clinic [phone]  Main [phone]    

## 2017-05-09 ENCOUNTER — Ambulatory Visit (INDEPENDENT_AMBULATORY_CARE_PROVIDER_SITE_OTHER): Payer: Managed Care, Other (non HMO) | Admitting: *Deleted

## 2017-05-09 DIAGNOSIS — Z5181 Encounter for therapeutic drug level monitoring: Secondary | ICD-10-CM

## 2017-05-09 DIAGNOSIS — I4821 Permanent atrial fibrillation: Secondary | ICD-10-CM

## 2017-05-09 DIAGNOSIS — I482 Chronic atrial fibrillation: Secondary | ICD-10-CM

## 2017-05-09 LAB — POCT INR: INR: 2.5

## 2017-05-09 NOTE — Patient Instructions (Signed)
Description   Continue on same dosage of 1 tablet daily except 1/2 tablet on Fridays. Recheck INR in 6 weeks.  Coumadin Clinic [phone]  Main [phone]    

## 2017-05-18 ENCOUNTER — Other Ambulatory Visit: Payer: Self-pay | Admitting: Cardiovascular Disease

## 2017-06-18 ENCOUNTER — Encounter: Payer: Self-pay | Admitting: Cardiovascular Disease

## 2017-06-20 ENCOUNTER — Ambulatory Visit (INDEPENDENT_AMBULATORY_CARE_PROVIDER_SITE_OTHER): Payer: Managed Care, Other (non HMO) | Admitting: *Deleted

## 2017-06-20 DIAGNOSIS — Z5181 Encounter for therapeutic drug level monitoring: Secondary | ICD-10-CM

## 2017-06-20 DIAGNOSIS — I482 Chronic atrial fibrillation: Secondary | ICD-10-CM | POA: Diagnosis not present

## 2017-06-20 DIAGNOSIS — I4821 Permanent atrial fibrillation: Secondary | ICD-10-CM

## 2017-06-20 LAB — POCT INR: INR: 2.5

## 2017-06-20 NOTE — Patient Instructions (Signed)
Description   Continue on same dosage of 1 tablet daily except 1/2 tablet on Fridays. Recheck INR in 6 weeks.  Coumadin Clinic [phone]  Main [phone]    

## 2017-06-30 NOTE — Progress Notes (Signed)
67 y.o. chronic longstanding afib. Seen by Doristine Devoid in [place] [facility] 09/26/16 with dyspnea She did not have elevated rates and INR;s have been Rx Labs including TSH were ok. She is an active smoker with some wheezing   F/U echo 10/15/16 reviewed EF 60-65% no valve disease mild LAE F/U myovue same day normal EF 73%  PFT;s 10/25/16 minimal small airway disease  09/26/16 CXR mild CE NAD  She feels well INR;s Rx Some frustrations at work for [employer] but has  Been there 12 years  Compliant with meds no palpitations or dyspnea    Past Medical History:  Diagnosis Date  . Anxiety   . Atrial fibrillation ([place])   . Cataract    bilateral,starting  . Colon polyps   . Depression   . Diverticulosis   . Fracture of wrist, closed 08/2012   Left  . Hyperlipidemia   . Hypertension   . Migraine   . Skin cancer   . Sleep apnea    uses cpap  . 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 Wears dentures    top  . Wears glasses    Past Surgical History:  Procedure Laterality Date  . BUNIONECTOMY  05/2010   Left  . CARPAL TUNNEL RELEASE Left 01/19/2013   Procedure: LEFT CARPAL TUNNEL RELEASE;  Surgeon: Tennis Must, MD;  Location: [place] [facility];  Service: Orthopedics;  Laterality: Left;  . CESAREAN SECTION    . COLONOSCOPY    . DILATION AND CURETTAGE OF UTERUS  2005   endometrial polyp removed   . OPEN REDUCTION INTERNAL FIXATION (ORIF) DISTAL RADIAL FRACTURE Left 09/08/2012   Procedure: OPEN REDUCTION INTERNAL FIXATION (ORIF) DISTAL RADIAL FRACTURE;  Surgeon: Tennis Must, MD;  Location: [facility];  Service: Orthopedics;  Laterality: Left;  . POLYPECTOMY    . SKIN CANCER EXCISION    . TUBAL LIGATION      Current Outpatient Medications  Medication Sig Dispense Refill  . atorvastatin (LIPITOR) 20 MG tablet Take 20 mg by mouth daily.     . metoprolol succinate (TOPROL-XL) 50 MG 24 hr tablet take 1 tablet by mouth once daily  with food 30 tablet 11  . quinapril (ACCUPRIL) 20 MG tablet Take 20 mg by mouth daily.     . sertraline (ZOLOFT) 50 MG tablet Take 50 mg by mouth at bedtime.     . verapamil (CALAN-SR) 240 MG CR tablet Take 1 tablet (240 mg total) by mouth at bedtime. Please keep upcoming appt for future refills. Thank you 90 tablet 0  . warfarin (COUMADIN) 5 MG tablet TAKE TABLETS BY MOUTH AS DIRECTED BY COUMADIN CLINIC 35 tablet 3   No current facility-administered medications for this visit.     Allergies  Allergen Reactions  . Sulfa Antibiotics Other (See Comments)    Unknown to sx, had allergy as a child  . Sulfonamide Derivatives Rash    Social History   Socioeconomic History  . Marital status: Married    Spouse name: Not on file  . Number of children: 1  . Years of education: Not on file  . Highest education level: Not on file  Occupational History  . Occupation: Research scientist (physical sciences): [employer]  Social Needs  . Financial resource strain: Not on file  . Food insecurity:    Worry: Not on file    Inability: Not on file  . Transportation  needs:    Medical: Not on file    Non-medical: Not on file  Tobacco Use  . Smoking status: Current Every Day Smoker    Years: 8.00    Types: Cigarettes  . Smokeless tobacco: Never Used  . Tobacco comment: 3 a day  Substance and Sexual Activity  . Alcohol use: Yes    Alcohol/week: 0.0 oz    Comment: occasional  . Drug use: No  . Sexual activity: Not Currently    Partners: Male    Birth control/protection: Surgical, Post-menopausal    Comment: tubal  Lifestyle  . Physical activity:    Days per week: Not on file    Minutes per session: Not on file  . Stress: Not on file  Relationships  . Social connections:    Talks on phone: Not on file    Gets together: Not on file    Attends religious service: Not on file    Active member of club or organization: Not on file    Attends meetings of clubs or organizations: Not on file     Relationship status: Not on file  . Intimate partner violence:    Fear of current or ex partner: Not on file    Emotionally abused: Not on file    Physically abused: Not on file    Forced sexual activity: Not on file  Other Topics Concern  . Not on file  Social History Narrative  . Not on file    Family History  Problem Relation Age of Onset  . Lung cancer Mother   . Stomach cancer Mother   . Lung cancer Father   . COPD Sister   . Emphysema Sister   . Breast cancer Paternal Grandmother   . Colon cancer Neg Hx   . Esophageal cancer Neg Hx   . Rectal cancer Neg Hx     ROS- All systems are reviewed and negative except as per the HPI above  Physical Exam: Vitals:   07/03/17 1627  BP: 130/82  Pulse: 63  SpO2: 96%  Weight: 208 lb 8 oz (94.6 kg)  Height: 5\' 2"  (1.575 m)   Wt Readings from Last 3 Encounters:  07/03/17 208 lb 8 oz (94.6 kg)  12/30/16 209 lb (94.8 kg)  11/26/16 209 lb 2 oz (94.9 kg)    Labs: Lab Results  Component Value Date   NA 137 05/30/2014   K 4.0 05/30/2014   CL 105 05/30/2014   CO2 27 05/30/2014   GLUCOSE 76 05/30/2014   BUN 16 05/30/2014   CREATININE 1.06 05/30/2014   CALCIUM 9.3 05/30/2014   Lab Results  Component Value Date   INR 2.5 06/20/2017   Lab Results  Component Value Date   CHOL 192 04/06/2008   HDL 37.1 (L) 04/06/2008   LDLCALC 125 (H) 04/06/2008   TRIG 151 (H) 04/06/2008     Vitals:   07/03/17 1627  BP: 130/82  Pulse: 63  SpO2: 96%    Affect appropriate Healthy:  appears stated age HEENT: normal Neck supple with no adenopathy JVP normal no bruits no thyromegaly Lungs clear with no wheezing and good diaphragmatic motion Heart:  S1/S2 no murmur, no rub, gallop or click PMI normal Abdomen: benighn, BS positve, no tenderness, no AAA 6 no bruit.  No HSM or HJR Distal pulses intact with no bruits No edema Neuro non-focal Skin warm and dry No muscular weakness   ECG:  09/26/16 afib rate 81 nonspecific ST  changes  Assessment and Plan:  1. Afib: chronic good rate control and anticoagulation continue coumadin  2. Dyspnea: no cardiac etiology noted with NAD CXR normal EF echo no ischemia on myovue  3. Smoking:  PFTls not bad but contributes to dyspnea counseled on cessation for less than 10 mintues 4. HTN: Well controlled.  Continue current medications and low sodium Dash type diet.   5. HLD:  Continue statin labs with primary  6.: Depression: mood appropriate continue Zoloft

## 2017-07-02 ENCOUNTER — Ambulatory Visit: Payer: Managed Care, Other (non HMO) | Admitting: Cardiovascular Disease

## 2017-07-03 ENCOUNTER — Ambulatory Visit (INDEPENDENT_AMBULATORY_CARE_PROVIDER_SITE_OTHER): Payer: Managed Care, Other (non HMO) | Admitting: Cardiovascular Disease

## 2017-07-03 ENCOUNTER — Encounter: Payer: Self-pay | Admitting: Cardiovascular Disease

## 2017-07-03 VITALS — BP 130/82 | HR 63 | Ht 62.0 in | Wt 208.5 lb

## 2017-07-03 DIAGNOSIS — I480 Paroxysmal atrial fibrillation: Secondary | ICD-10-CM

## 2017-07-03 NOTE — Patient Instructions (Signed)

## 2017-07-20 ENCOUNTER — Other Ambulatory Visit: Payer: Self-pay | Admitting: Cardiovascular Disease

## 2017-08-01 ENCOUNTER — Ambulatory Visit (INDEPENDENT_AMBULATORY_CARE_PROVIDER_SITE_OTHER): Payer: Managed Care, Other (non HMO) | Admitting: *Deleted

## 2017-08-01 DIAGNOSIS — I482 Chronic atrial fibrillation: Secondary | ICD-10-CM

## 2017-08-01 DIAGNOSIS — I4821 Permanent atrial fibrillation: Secondary | ICD-10-CM

## 2017-08-01 DIAGNOSIS — Z5181 Encounter for therapeutic drug level monitoring: Secondary | ICD-10-CM

## 2017-08-01 LAB — POCT INR: INR: 2.5 (ref 2.0–3.0)

## 2017-08-01 NOTE — Patient Instructions (Signed)
Description   Continue on same dosage of 1 tablet daily except 1/2 tablet on Fridays. Recheck INR in 6 weeks.  Coumadin Clinic [phone]  Main [phone]    

## 2017-08-19 ENCOUNTER — Other Ambulatory Visit: Payer: Self-pay | Admitting: Cardiovascular Disease

## 2017-09-12 ENCOUNTER — Other Ambulatory Visit: Payer: Self-pay | Admitting: Internal Medicine

## 2017-09-12 ENCOUNTER — Ambulatory Visit (INDEPENDENT_AMBULATORY_CARE_PROVIDER_SITE_OTHER): Payer: Managed Care, Other (non HMO)

## 2017-09-12 DIAGNOSIS — I4821 Permanent atrial fibrillation: Secondary | ICD-10-CM

## 2017-09-12 DIAGNOSIS — I482 Chronic atrial fibrillation: Secondary | ICD-10-CM

## 2017-09-12 DIAGNOSIS — Z5181 Encounter for therapeutic drug level monitoring: Secondary | ICD-10-CM

## 2017-09-12 DIAGNOSIS — Z720 Tobacco use: Secondary | ICD-10-CM

## 2017-09-12 LAB — POCT INR: INR: 2.6 (ref 2.0–3.0)

## 2017-09-12 NOTE — Patient Instructions (Signed)
Description   Continue on same dosage of 1 tablet daily except 1/2 tablet on Fridays. Recheck INR in 6 weeks.  Coumadin Clinic [phone]  Main [phone]    

## 2017-09-29 ENCOUNTER — Ambulatory Visit
Admission: RE | Admit: 2017-09-29 | Discharge: 2017-09-29 | Disposition: A | Discharge status: Home / Self Care | Payer: Managed Care, Other (non HMO) | Source: Ambulatory Visit | Attending: Internal Medicine | Admitting: Internal Medicine

## 2017-09-29 DIAGNOSIS — Z720 Tobacco use: Secondary | ICD-10-CM

## 2017-10-04 ENCOUNTER — Other Ambulatory Visit: Payer: Self-pay | Admitting: Cardiovascular Disease

## 2017-10-24 ENCOUNTER — Ambulatory Visit (INDEPENDENT_AMBULATORY_CARE_PROVIDER_SITE_OTHER): Payer: Managed Care, Other (non HMO) | Admitting: *Deleted

## 2017-10-24 DIAGNOSIS — I482 Chronic atrial fibrillation: Secondary | ICD-10-CM

## 2017-10-24 DIAGNOSIS — Z5181 Encounter for therapeutic drug level monitoring: Secondary | ICD-10-CM

## 2017-10-24 DIAGNOSIS — I4821 Permanent atrial fibrillation: Secondary | ICD-10-CM

## 2017-10-24 LAB — POCT INR: INR: 2.3 (ref 2.0–3.0)

## 2017-10-24 NOTE — Patient Instructions (Signed)
Description   Continue on same dosage of 1 tablet daily except 1/2 tablet on Fridays. Recheck INR in 6 weeks.  Coumadin Clinic [phone]  Main [phone]    

## 2017-12-05 ENCOUNTER — Ambulatory Visit (INDEPENDENT_AMBULATORY_CARE_PROVIDER_SITE_OTHER): Payer: Managed Care, Other (non HMO)

## 2017-12-05 DIAGNOSIS — I4821 Permanent atrial fibrillation: Secondary | ICD-10-CM | POA: Diagnosis not present

## 2017-12-05 DIAGNOSIS — Z5181 Encounter for therapeutic drug level monitoring: Secondary | ICD-10-CM

## 2017-12-05 LAB — POCT INR: INR: 2.9 (ref 2.0–3.0)

## 2017-12-05 NOTE — Patient Instructions (Signed)
Description   Continue on same dosage of 1 tablet daily except 1/2 tablet on Fridays. Recheck INR in 6 weeks.  Coumadin Clinic [phone]  Main [phone]    

## 2018-01-16 ENCOUNTER — Ambulatory Visit (INDEPENDENT_AMBULATORY_CARE_PROVIDER_SITE_OTHER): Payer: Managed Care, Other (non HMO) | Admitting: *Deleted

## 2018-01-16 DIAGNOSIS — Z5181 Encounter for therapeutic drug level monitoring: Secondary | ICD-10-CM

## 2018-01-16 DIAGNOSIS — I4821 Permanent atrial fibrillation: Secondary | ICD-10-CM

## 2018-01-16 LAB — POCT INR: INR: 2.9 (ref 2.0–3.0)

## 2018-01-16 NOTE — Patient Instructions (Signed)
Description   Continue on same dosage of 1 tablet daily except 1/2 tablet on Fridays. Recheck INR in 6 weeks.  Coumadin Clinic [phone]  Main [phone]

## 2018-02-27 ENCOUNTER — Ambulatory Visit (INDEPENDENT_AMBULATORY_CARE_PROVIDER_SITE_OTHER): Payer: Managed Care, Other (non HMO) | Admitting: *Deleted

## 2018-02-27 DIAGNOSIS — Z5181 Encounter for therapeutic drug level monitoring: Secondary | ICD-10-CM | POA: Diagnosis not present

## 2018-02-27 DIAGNOSIS — I4821 Permanent atrial fibrillation: Secondary | ICD-10-CM

## 2018-02-27 LAB — POCT INR: INR: 2.4 (ref 2.0–3.0)

## 2018-02-27 NOTE — Patient Instructions (Signed)
Description   Continue on same dosage of 1 tablet daily except 1/2 tablet on Fridays. Recheck INR in 6 weeks.  Coumadin Clinic [phone]  Main [phone]

## 2018-02-28 ENCOUNTER — Other Ambulatory Visit: Payer: Self-pay | Admitting: Cardiovascular Disease

## 2018-04-10 ENCOUNTER — Ambulatory Visit (INDEPENDENT_AMBULATORY_CARE_PROVIDER_SITE_OTHER): Payer: Managed Care, Other (non HMO) | Admitting: Pharmacist

## 2018-04-10 DIAGNOSIS — I4821 Permanent atrial fibrillation: Secondary | ICD-10-CM

## 2018-04-10 DIAGNOSIS — Z5181 Encounter for therapeutic drug level monitoring: Secondary | ICD-10-CM | POA: Diagnosis not present

## 2018-04-10 LAB — POCT INR: INR: 2.7 (ref 2.0–3.0)

## 2018-04-10 NOTE — Patient Instructions (Signed)
Description   Continue on same dosage of 1 tablet daily except 1/2 tablet on Fridays. Recheck INR in 6 weeks.  Coumadin Clinic [phone]  Main [phone]

## 2018-05-14 ENCOUNTER — Other Ambulatory Visit: Payer: Self-pay | Admitting: Cardiovascular Disease

## 2018-05-20 ENCOUNTER — Telehealth: Payer: Self-pay | Admitting: Pharmacist Clinician (PhC)/ Clinical Pharmacy Specialist

## 2018-05-20 ENCOUNTER — Telehealth: Payer: Self-pay

## 2018-05-20 NOTE — Telephone Encounter (Signed)

## 2018-05-22 ENCOUNTER — Ambulatory Visit (INDEPENDENT_AMBULATORY_CARE_PROVIDER_SITE_OTHER): Payer: Managed Care, Other (non HMO) | Admitting: *Deleted

## 2018-05-22 DIAGNOSIS — Z5181 Encounter for therapeutic drug level monitoring: Secondary | ICD-10-CM | POA: Diagnosis not present

## 2018-05-22 DIAGNOSIS — I4821 Permanent atrial fibrillation: Secondary | ICD-10-CM

## 2018-05-22 LAB — POCT INR: INR: 3.3 — AB (ref 2.0–3.0)

## 2018-06-25 ENCOUNTER — Telehealth: Payer: Self-pay

## 2018-06-25 NOTE — Telephone Encounter (Signed)
lmom for prescreen  

## 2018-06-25 NOTE — Telephone Encounter (Signed)

## 2018-06-26 ENCOUNTER — Ambulatory Visit (INDEPENDENT_AMBULATORY_CARE_PROVIDER_SITE_OTHER): Payer: Managed Care, Other (non HMO) | Admitting: *Deleted

## 2018-06-26 DIAGNOSIS — I4821 Permanent atrial fibrillation: Secondary | ICD-10-CM | POA: Diagnosis not present

## 2018-06-26 DIAGNOSIS — Z5181 Encounter for therapeutic drug level monitoring: Secondary | ICD-10-CM | POA: Diagnosis not present

## 2018-06-26 LAB — POCT INR: INR: 2.8 (ref 2.0–3.0)

## 2018-06-29 ENCOUNTER — Other Ambulatory Visit: Payer: Self-pay

## 2018-06-30 ENCOUNTER — Other Ambulatory Visit: Payer: Self-pay | Admitting: Cardiovascular Disease

## 2018-06-30 ENCOUNTER — Telehealth: Payer: Self-pay | Admitting: Pharmacist

## 2018-06-30 NOTE — Telephone Encounter (Signed)
OK per Nishan to change to DOAC. LM to discuss with pt.

## 2018-06-30 NOTE — Telephone Encounter (Signed)
-----   Message from Josue Hector, MD sent at 06/26/2018  5:23 PM EDT ----- Regarding: RE: DOAC Ok to change ----- Message ----- From: Erskine Emery, [facility] Sent: 06/26/2018  12:09 PM EDT To: Josue Hector, MD Subject: DOAC                                           Dr. Johnsie Cancel,  Pt is interested in [place], but would like your input. Please advise if ok to change to DOAC.   Thank you! Georgina Peer

## 2018-07-01 MED ORDER — APIXABAN 5 MG PO TABS: 5.0000 mg | ORAL_TABLET | Freq: Two times a day (BID) | ORAL | 0 refills | Status: DC

## 2018-07-01 NOTE — Telephone Encounter (Signed)
LMOM. If no response tomorrow will plan to transition at f/u Coumadin appt.

## 2018-07-01 NOTE — Telephone Encounter (Signed)
Follow Up:; ° ° °Returning your call. °

## 2018-07-01 NOTE — Telephone Encounter (Signed)
Spoke with patient about transition to Eliquis. She will transition now by discontinuing warfarin and skipping a day then starting Eliquis 5mg  BID the following day. All questions about Eliquis were answered.   Scr 1.0 on 09/01/17 in [place]. Pt aware to call with any questions or concerns.   Copay card sent to pharmacy on RX. INR recheck cancelled.

## 2018-07-09 NOTE — Progress Notes (Signed)
Virtual Visit via Telephone Note   This visit type was conducted due to national recommendations for restrictions regarding the COVID-19 Pandemic (e.g. social distancing) in an effort to limit this patient's exposure and mitigate transmission in our community.  Due to her co-morbid illnesses, this patient is at least at moderate risk for complications without adequate follow up.  This format is felt to be most appropriate for this patient at this time.  The patient did not have access to video technology/had technical difficulties with video requiring transitioning to audio format only (telephone).  All issues noted in this document were discussed and addressed.  No physical exam could be performed with this format.  Please refer to the patient's chart for her  consent to telehealth for [facility].   Date:  07/16/2018   ID:  Linda Ruiz, DOB March 15, 1950, MRN [id number]  Patient Location: Home Provider Location: Office  PCP:  Burnard Bunting, MD  Cardiologist:  Johnsie Cancel Electrophysiologist:  None   Evaluation Performed:  Follow-Up Visit  Chief Complaint:  Afib  History of Present Illness:    Linda Ruiz is a 68 y.o. female with chronic afib. Finally transitioned to eliquis from coumadin May 2020. Still smoking History of HTN and HLD Last echo 10/15/16 EF 60-65% mild LAD no significant valve disease Myovue normal 10/15/16 EF 73% no ischemia. Working from home at [employer] Daughter works at [employer] and just got hours cut. Both parents died of lung cancer but she is unwilling to quit   The patient does not have symptoms concerning for COVID-19 infection (fever, chills, cough, or new shortness of breath).    Past Medical History:  Diagnosis Date  . Anxiety   . Atrial fibrillation ([place])   . Cataract    bilateral,starting  . Colon polyps   . Depression   . Diverticulosis   . Fracture of wrist, closed 08/2012   Left  . Hyperlipidemia   . Hypertension   . Migraine    . Skin cancer   . Sleep apnea    uses cpap  . 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 Wears dentures    top  . Wears glasses    Past Surgical History:  Procedure Laterality Date  . BUNIONECTOMY  05/2010   Left  . CARPAL TUNNEL RELEASE Left 01/19/2013   Procedure: LEFT CARPAL TUNNEL RELEASE;  Surgeon: Tennis Must, MD;  Location: [place];  Service: Orthopedics;  Laterality: Left;  . CESAREAN SECTION    . COLONOSCOPY    . DILATION AND CURETTAGE OF UTERUS  2005   endometrial polyp removed   . OPEN REDUCTION INTERNAL FIXATION (ORIF) DISTAL RADIAL FRACTURE Left 09/08/2012   Procedure: OPEN REDUCTION INTERNAL FIXATION (ORIF) DISTAL RADIAL FRACTURE;  Surgeon: Tennis Must, MD;  Location: [place];  Service: Orthopedics;  Laterality: Left;  . POLYPECTOMY    . SKIN CANCER EXCISION    . TUBAL LIGATION       Current Meds  Medication Sig  . apixaban (ELIQUIS) 5 MG TABS tablet Take 1 tablet (5 mg total) by mouth 2 (two) times daily.  Marland Kitchen atorvastatin (LIPITOR) 20 MG tablet Take 20 mg by mouth daily.   . metoprolol succinate (TOPROL-XL) 50 MG 24 hr tablet TAKE 1 TABLET BY MOUTH ONCE DAILY WITH FOOD  . quinapril (ACCUPRIL) 20 MG tablet Take 20 mg by mouth daily.   . sertraline (ZOLOFT) 50 MG  tablet Take 50 mg by mouth at bedtime.   . verapamil (CALAN-SR) 240 MG CR tablet TAKE 1 TABLET(240 MG) BY MOUTH AT BEDTIME     Allergies:   Sulfa antibiotics; Sulfasalazine; and Sulfonamide derivatives   Social History   Tobacco Use  . Smoking status: Current Every Day Smoker    Years: 8.00    Types: Cigarettes  . Smokeless tobacco: Never Used  . Tobacco comment: 3 a day  Substance Use Topics  . Alcohol use: Yes    Alcohol/week: 0.0 standard drinks    Comment: occasional  . Drug use: No     Family Hx: The patient's family history includes Breast cancer in her paternal grandmother; COPD in her sister; Emphysema in her sister;  Lung cancer in her father and mother; Stomach cancer in her mother. There is no history of Colon cancer, Esophageal cancer, or Rectal cancer.  ROS:   Please see the history of present illness.     All other systems reviewed and are negative.   Prior CV studies:   The following studies were reviewed today:  TTE 10/15/16 Myovue 10/15/16 PFT;s 10/25/16  Minimal small airway dx CXR 09/26/16 mild CE NAD Lung cancer screening CT 09/29/17 no lesions   Labs/Other Tests and Data Reviewed:    EKG:  afib rate 81 low voltage   Recent Labs: No results found for requested labs within last 8760 hours.   Recent Lipid Panel Lab Results  Component Value Date/Time   CHOL 192 04/06/2008 09:23 AM   TRIG 151 (H) 04/06/2008 09:23 AM   HDL 37.1 (L) 04/06/2008 09:23 AM   CHOLHDL 5.2 CALC 04/06/2008 09:23 AM   LDLCALC 125 (H) 04/06/2008 09:23 AM    Wt Readings from Last 3 Encounters:  07/16/18 93 kg  07/03/17 94.6 kg  12/30/16 94.8 kg     Objective:    Vital Signs:  Ht 5\' 2"  (1.575 m)   Wt 93 kg   LMP 10/27/2006   BMI 37.49 kg/m    Telephone visit no exam   ASSESSMENT & PLAN:    1. Afib: chronic good rate control and anticoagulation continue eliquis and Toprol 2. HTN: Well controlled.  Continue current medications and low sodium Dash type diet.   3. HLD  Continue lipitor labs with Dr Reynaldo Minium 4. Smoking counseled on cessation Lung cancer screening CT negative August 2019 5. Depression:  On Zoloft stable   COVID-19 Education: The signs and symptoms of COVID-19 were discussed with the patient and how to seek care for testing (follow up with PCP or arrange E-visit).  The importance of social distancing was discussed today.  Time:   Today, I have spent 30 minutes with the patient with telehealth technology discussing the above problems.     Medication Adjustments/Labs and Tests Ordered: Current medicines are reviewed at length with the patient today.  Concerns regarding medicines are  outlined above.   Tests Ordered: No orders of the defined types were placed in this encounter.   Medication Changes: No orders of the defined types were placed in this encounter.   Disposition:  Follow up in a year  Signed, Jenkins Rouge, MD  07/16/2018 8:54 AM    [place]

## 2018-07-13 ENCOUNTER — Other Ambulatory Visit: Payer: Self-pay | Admitting: Cardiovascular Disease

## 2018-07-16 ENCOUNTER — Ambulatory Visit: Payer: Managed Care, Other (non HMO) | Admitting: Cardiovascular Disease

## 2018-07-16 ENCOUNTER — Telehealth (INDEPENDENT_AMBULATORY_CARE_PROVIDER_SITE_OTHER): Payer: Managed Care, Other (non HMO) | Admitting: Cardiovascular Disease

## 2018-07-16 ENCOUNTER — Other Ambulatory Visit: Payer: Self-pay

## 2018-07-16 ENCOUNTER — Encounter: Payer: Self-pay | Admitting: Cardiovascular Disease

## 2018-07-16 VITALS — Ht 62.0 in | Wt 205.0 lb

## 2018-07-16 DIAGNOSIS — E785 Hyperlipidemia, unspecified: Secondary | ICD-10-CM | POA: Diagnosis not present

## 2018-07-16 DIAGNOSIS — I10 Essential (primary) hypertension: Secondary | ICD-10-CM | POA: Diagnosis not present

## 2018-07-16 DIAGNOSIS — I482 Chronic atrial fibrillation, unspecified: Secondary | ICD-10-CM | POA: Diagnosis not present

## 2018-07-16 DIAGNOSIS — F329 Major depressive disorder, single episode, unspecified: Secondary | ICD-10-CM | POA: Diagnosis not present

## 2018-07-16 DIAGNOSIS — Z79899 Other long term (current) drug therapy: Secondary | ICD-10-CM

## 2018-07-16 DIAGNOSIS — Z7901 Long term (current) use of anticoagulants: Secondary | ICD-10-CM

## 2018-07-16 DIAGNOSIS — Z720 Tobacco use: Secondary | ICD-10-CM

## 2018-07-16 NOTE — Patient Instructions (Signed)

## 2018-08-18 ENCOUNTER — Other Ambulatory Visit: Payer: Self-pay | Admitting: Cardiovascular Disease

## 2018-09-30 ENCOUNTER — Other Ambulatory Visit: Payer: Self-pay | Admitting: Cardiovascular Disease

## 2018-09-30 NOTE — Telephone Encounter (Signed)
54f 93.9kg Scr 1.0 09/09/18 Lovw/nishan 07/16/18

## 2019-03-05 NOTE — Progress Notes (Signed)
Date:  03/16/2019   ID:  Linda Ruiz, DOB 02-20-1951, MRN [id number]  Provider Location: Office  PCP:  Burnard Bunting, MD  Cardiologist:  Johnsie Cancel Electrophysiologist:  None   Evaluation Performed:  Follow-Up Visit  Chief Complaint:  Afib  History of Present Illness:    Linda Ruiz is a 69 y.o. female with chronic afib. Finally transitioned to eliquis from coumadin May 2020. Still smoking History of HTN and HLD Last echo 10/15/16 EF 60-65% mild LAD no significant valve disease Myovue normal 10/15/16 EF 73% no ischemia. Working from home at [employer] Daughter works at [employer] and just got hours cut. Both parents died of lung cancer but she is unwilling to quit Lung cancer screening CT 09/30/17 no cancer needs to be updated   She indicates retiring in March from [place] Husband home use to run [place]  Dr Reynaldo Minium changing her BP meds to start Benicar and stop accupril    The patient does not have symptoms concerning for COVID-19 infection (fever, chills, cough, or new shortness of breath).    Past Medical History:  Diagnosis Date  . Anxiety   . Atrial fibrillation ([place])   . Cataract    bilateral,starting  . Colon polyps   . Depression   . Diverticulosis   . Fracture of wrist, closed 08/2012   Left  . Hyperlipidemia   . Hypertension   . Migraine   . Skin cancer   . Sleep apnea    uses cpap  . 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 Wears dentures    top  . Wears glasses    Past Surgical History:  Procedure Laterality Date  . BUNIONECTOMY  05/2010   Left  . CARPAL TUNNEL RELEASE Left 01/19/2013   Procedure: LEFT CARPAL TUNNEL RELEASE;  Surgeon: Tennis Must, MD;  Location: [place];  Service: Orthopedics;  Laterality: Left;  . CESAREAN SECTION    . COLONOSCOPY    . DILATION AND CURETTAGE OF UTERUS  2005   endometrial polyp removed   . OPEN REDUCTION INTERNAL FIXATION (ORIF) DISTAL RADIAL  FRACTURE Left 09/08/2012   Procedure: OPEN REDUCTION INTERNAL FIXATION (ORIF) DISTAL RADIAL FRACTURE;  Surgeon: Tennis Must, MD;  Location: [place];  Service: Orthopedics;  Laterality: Left;  . POLYPECTOMY    . SKIN CANCER EXCISION    . TUBAL LIGATION       Current Meds  Medication Sig  . atorvastatin (LIPITOR) 20 MG tablet Take 20 mg by mouth daily.   Marland Kitchen ELIQUIS 5 MG TABS tablet TAKE 1 TABLET(5 MG) BY MOUTH TWICE DAILY  . metoprolol succinate (TOPROL-XL) 50 MG 24 hr tablet TAKE 1 TABLET BY MOUTH ONCE DAILY WITH FOOD  . quinapril (ACCUPRIL) 20 MG tablet Take 20 mg by mouth 2 (two) times daily.   . sertraline (ZOLOFT) 50 MG tablet Take 50 mg by mouth at bedtime.   . verapamil (CALAN-SR) 240 MG CR tablet TAKE 1 TABLET(240 MG) BY MOUTH AT BEDTIME     Allergies:   Sulfa antibiotics, Sulfasalazine, and Sulfonamide derivatives   Social History   Tobacco Use  . Smoking status: Current Every Day Smoker    Years: 8.00    Types: Cigarettes  . Smokeless tobacco: Never Used  . Tobacco comment: 3 a day  Substance Use Topics  . Alcohol use: Yes    Alcohol/week: 0.0 standard drinks  Comment: occasional  . Drug use: No     Family Hx: The patient's family history includes Breast cancer in her paternal grandmother; COPD in her sister; Emphysema in her sister; Lung cancer in her father and mother; Stomach cancer in her mother. There is no history of Colon cancer, Esophageal cancer, or Rectal cancer.  ROS:   Please see the history of present illness.     All other systems reviewed and are negative.   Prior CV studies:   The following studies were reviewed today:  TTE 10/15/16 Myovue 10/15/16 PFT;s 10/25/16  Minimal small airway dx CXR 09/26/16 mild CE NAD Lung cancer screening CT 09/29/17 no lesions   Labs/Other Tests and Data Reviewed:    EKG:  03/16/19  afib rate 55 low voltage   Recent Labs: No results found for requested labs within last 8760 hours.   Recent  Lipid Panel Lab Results  Component Value Date/Time   CHOL 192 04/06/2008 09:23 AM   TRIG 151 (H) 04/06/2008 09:23 AM   HDL 37.1 (L) 04/06/2008 09:23 AM   CHOLHDL 5.2 CALC 04/06/2008 09:23 AM   LDLCALC 125 (H) 04/06/2008 09:23 AM    Wt Readings from Last 3 Encounters:  03/16/19 207 lb 6.4 oz (94.1 kg)  07/16/18 205 lb (93 kg)  07/03/17 208 lb 8 oz (94.6 kg)     Objective:    Vital Signs:  BP 138/72   Pulse (!) 51   Ht 5\' 2"  (1.575 m)   Wt 207 lb 6.4 oz (94.1 kg)   LMP 10/27/2006   SpO2 96%   BMI 37.93 kg/m    Telephone visit no exam   ASSESSMENT & PLAN:    1. Afib: chronic good rate control and anticoagulation continue eliquis and Toprol 2. HTN: F/U Reynaldo Minium starting Benicar  3. HLD  Continue lipitor labs with Dr Reynaldo Minium 4. Smoking counseled on cessation Lung cancer screening CT negative August 2019 will repeat She indicates  Wanting to quit in a month  5. Depression:  On Zoloft stable   COVID-19 Education: The signs and symptoms of COVID-19 were discussed with the patient and how to seek care for testing (follow up with PCP or arrange E-visit).  The importance of social distancing was discussed today.   Medication Adjustments/Labs and Tests Ordered: Current medicines are reviewed at length with the patient today.  Concerns regarding medicines are outlined above.   Tests Ordered:  Lung cancer screening CT   Medication Changes: No orders of the defined types were placed in this encounter.   Disposition:  Follow up in a year  Signed, Jenkins Rouge, MD  03/16/2019 2:16 PM    [facility]

## 2019-03-16 ENCOUNTER — Ambulatory Visit (INDEPENDENT_AMBULATORY_CARE_PROVIDER_SITE_OTHER): Payer: Managed Care, Other (non HMO) | Admitting: Cardiovascular Disease

## 2019-03-16 ENCOUNTER — Other Ambulatory Visit: Payer: Self-pay

## 2019-03-16 ENCOUNTER — Encounter: Payer: Self-pay | Admitting: Cardiovascular Disease

## 2019-03-16 VITALS — BP 138/72 | HR 51 | Ht 62.0 in | Wt 207.4 lb

## 2019-03-16 DIAGNOSIS — Z87891 Personal history of nicotine dependence: Secondary | ICD-10-CM | POA: Diagnosis not present

## 2019-03-16 DIAGNOSIS — I4821 Permanent atrial fibrillation: Secondary | ICD-10-CM

## 2019-03-16 NOTE — Patient Instructions (Addendum)
Medication Instructions:   *If you need a refill on your cardiac medications before your next appointment, please call your pharmacy*  Lab Work:  If you have labs (blood work) drawn today and your tests are completely normal, you will receive your results only by: Marland Kitchen MyChart Message (if you have MyChart) OR . A paper copy in the mail If you have any lab test that is abnormal or we need to change your treatment, we will call you to review the results.  Testing/Procedures: Non-Cardiac CT scanning for lung cancer screening, (CAT scanning), is a noninvasive, special x-ray that produces cross-sectional images of the body using x-rays and a computer. CT scans help physicians diagnose and treat medical conditions. For some CT exams, a contrast material is used to enhance visibility in the area of the body being studied. CT scans provide greater clarity and reveal more details than regular x-ray exams.  Follow-Up: At [facility], you and your health needs are our priority.  As part of our continuing mission to provide you with exceptional heart care, we have created designated Provider Care Teams.  These Care Teams include your primary Cardiologist (physician) and Advanced Practice Providers (APPs -  Physician Assistants and Nurse Practitioners) who all work together to provide you with the care you need, when you need it.  Your next appointment:   12 month(s)  The format for your next appointment:   In Person  Provider:   You may see Dr. Johnsie Cancel or one of the following Advanced Practice Providers on your designated Care Team:    Truitt Merle, NP  Cecilie Kicks, NP  Kathyrn Drown, NP

## 2019-04-06 ENCOUNTER — Ambulatory Visit (INDEPENDENT_AMBULATORY_CARE_PROVIDER_SITE_OTHER)
Admission: RE | Admit: 2019-04-06 | Discharge: 2019-04-06 | Disposition: A | Discharge status: Home / Self Care | Payer: Managed Care, Other (non HMO) | Source: Ambulatory Visit | Attending: Cardiovascular Disease | Admitting: Cardiovascular Disease

## 2019-04-06 ENCOUNTER — Other Ambulatory Visit: Payer: Self-pay

## 2019-04-06 DIAGNOSIS — Z87891 Personal history of nicotine dependence: Secondary | ICD-10-CM | POA: Diagnosis not present

## 2019-04-07 ENCOUNTER — Telehealth: Payer: Self-pay | Admitting: Cardiovascular Disease

## 2019-04-07 NOTE — Telephone Encounter (Signed)
Follow Up:   Returning your call, concerning herCT results.

## 2019-04-07 NOTE — Telephone Encounter (Signed)
Patient aware of results of CT. 

## 2019-05-09 ENCOUNTER — Other Ambulatory Visit: Payer: Self-pay | Admitting: Cardiovascular Disease

## 2019-05-19 ENCOUNTER — Encounter: Payer: Self-pay | Admitting: Certified Nurse Midwife

## 2019-07-04 ENCOUNTER — Other Ambulatory Visit: Payer: Self-pay | Admitting: Cardiovascular Disease

## 2019-09-25 ENCOUNTER — Other Ambulatory Visit: Payer: Self-pay | Admitting: Cardiovascular Disease

## 2019-09-27 DIAGNOSIS — M4800 Spinal stenosis, site unspecified: Secondary | ICD-10-CM | POA: Diagnosis not present

## 2019-09-27 DIAGNOSIS — E669 Obesity, unspecified: Secondary | ICD-10-CM | POA: Diagnosis not present

## 2019-09-27 DIAGNOSIS — I10 Essential (primary) hypertension: Secondary | ICD-10-CM | POA: Diagnosis not present

## 2019-09-27 DIAGNOSIS — J449 Chronic obstructive pulmonary disease, unspecified: Secondary | ICD-10-CM | POA: Diagnosis not present

## 2019-09-27 DIAGNOSIS — G4730 Sleep apnea, unspecified: Secondary | ICD-10-CM | POA: Diagnosis not present

## 2019-09-27 NOTE — Telephone Encounter (Signed)
Age 69, weight 94kg, SCr 0.9 on 03/30/19 in [place], last OV Jan 2021, afib indication

## 2019-11-30 DIAGNOSIS — M545 Low back pain, unspecified: Secondary | ICD-10-CM | POA: Diagnosis not present

## 2019-11-30 DIAGNOSIS — M5136 Other intervertebral disc degeneration, lumbar region: Secondary | ICD-10-CM | POA: Diagnosis not present

## 2019-11-30 DIAGNOSIS — I10 Essential (primary) hypertension: Secondary | ICD-10-CM | POA: Diagnosis not present

## 2019-11-30 DIAGNOSIS — M25551 Pain in right hip: Secondary | ICD-10-CM | POA: Diagnosis not present

## 2019-11-30 DIAGNOSIS — R319 Hematuria, unspecified: Secondary | ICD-10-CM | POA: Diagnosis not present

## 2019-11-30 DIAGNOSIS — Z23 Encounter for immunization: Secondary | ICD-10-CM | POA: Diagnosis not present

## 2019-11-30 DIAGNOSIS — I482 Chronic atrial fibrillation, unspecified: Secondary | ICD-10-CM | POA: Diagnosis not present

## 2019-12-14 DIAGNOSIS — L814 Other melanin hyperpigmentation: Secondary | ICD-10-CM | POA: Diagnosis not present

## 2019-12-14 DIAGNOSIS — L570 Actinic keratosis: Secondary | ICD-10-CM | POA: Diagnosis not present

## 2019-12-14 DIAGNOSIS — D2372 Other benign neoplasm of skin of left lower limb, including hip: Secondary | ICD-10-CM | POA: Diagnosis not present

## 2019-12-14 DIAGNOSIS — D485 Neoplasm of uncertain behavior of skin: Secondary | ICD-10-CM | POA: Diagnosis not present

## 2019-12-14 DIAGNOSIS — L821 Other seborrheic keratosis: Secondary | ICD-10-CM | POA: Diagnosis not present

## 2019-12-14 DIAGNOSIS — B078 Other viral warts: Secondary | ICD-10-CM | POA: Diagnosis not present

## 2020-01-23 ENCOUNTER — Other Ambulatory Visit: Payer: Self-pay | Admitting: Cardiovascular Disease

## 2020-01-30 IMAGING — CT CT CHEST LUNG CANCER SCREENING LOW DOSE W/O CM
2 of 5 series · 15 of 40 positions shown, 18 images · non-contrast
Comparison: None.

CLINICAL DATA: Lung cancer screening. Thirty pack-year history.
Current asymptomatic smoker

EXAM:
CT CHEST WITHOUT CONTRAST LOW-DOSE FOR LUNG CANCER SCREENING
TECHNIQUE: Multidetector CT imaging of the chest was performed following the
standard protocol without IV contrast.

[Series 4: lung 1.00 br44 cor · coronal · 0.68mm/px · 3 of 323 slices shown]
[im 65/323  lung]
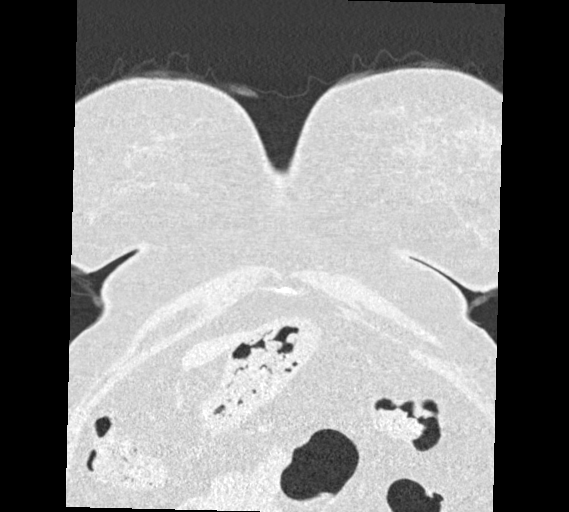
[im 129/323  lung]
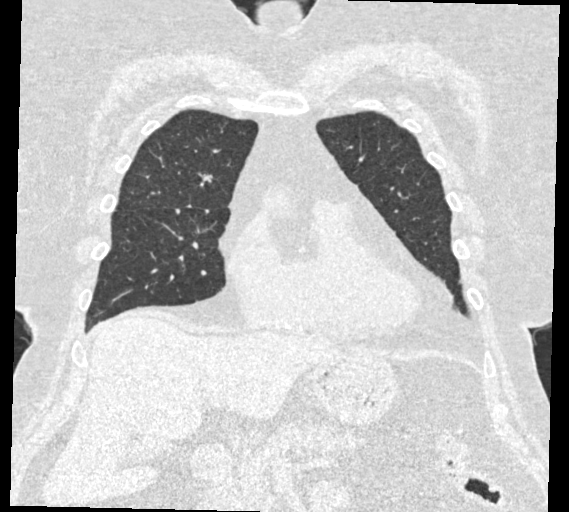
[im 194/323  lung]
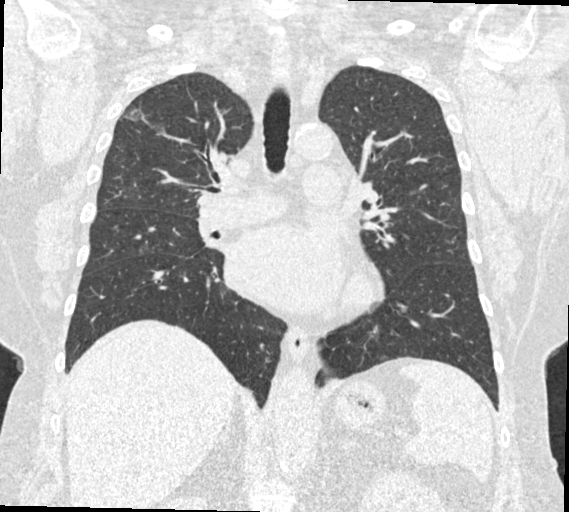

[Series 9: lung 1.00 br60 · axial · 0.76mm/px · z∈[-1348,-1033]mm · 12 of 349 slices shown, 15 images]
[im 17/349  mediastinal]
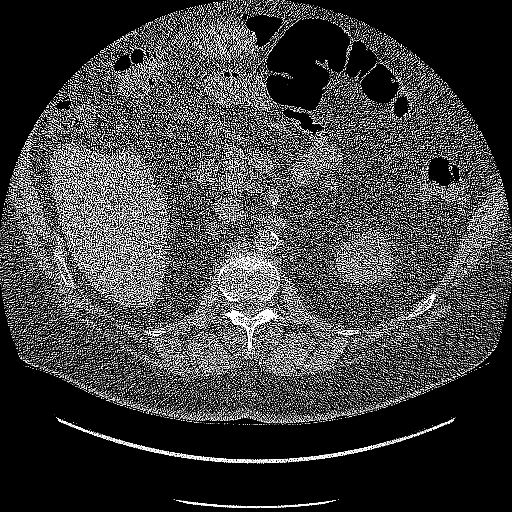
[im 17/349  lung]
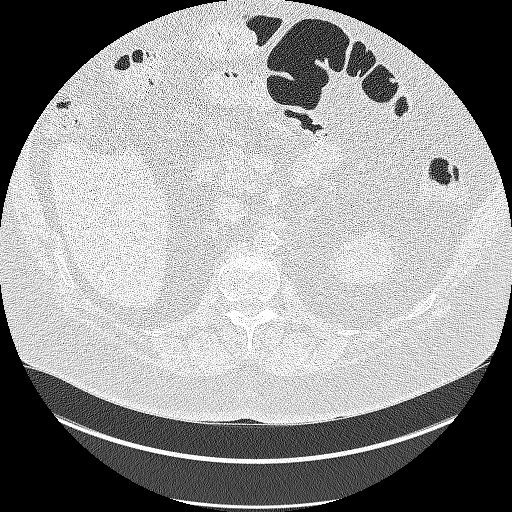
[im 50/349  lung]
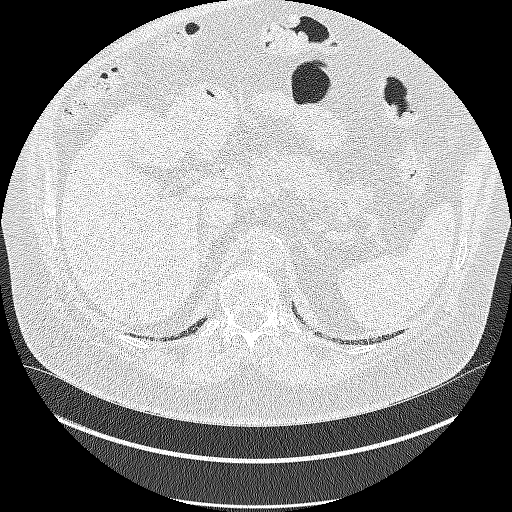
[im 83/349  lung]
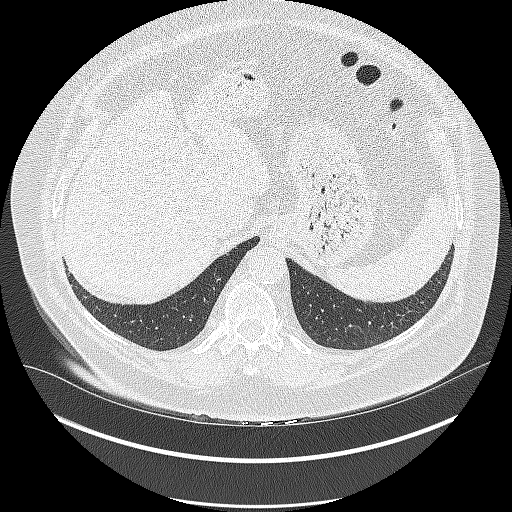
[im 100/349  lung]
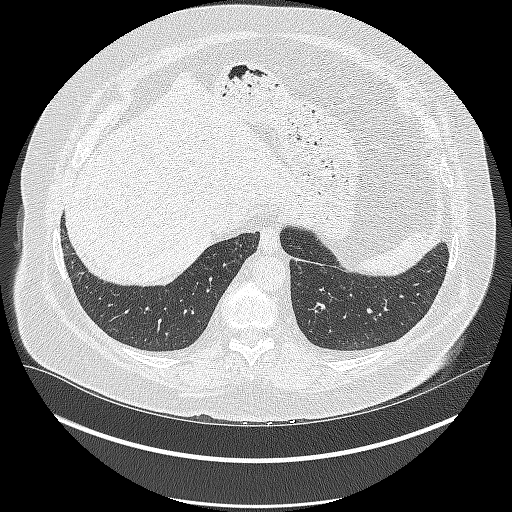
[im 133/349  mediastinal]
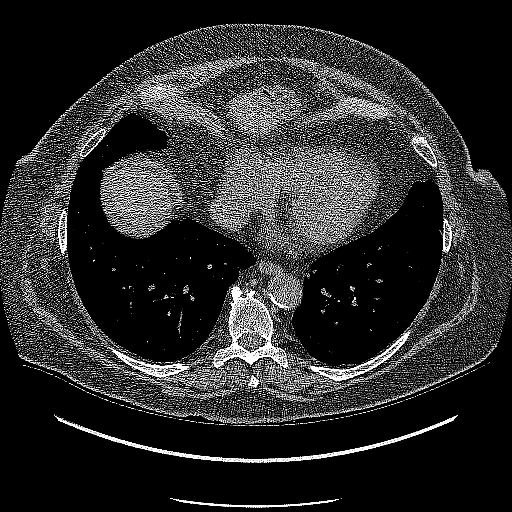
[im 133/349  lung]
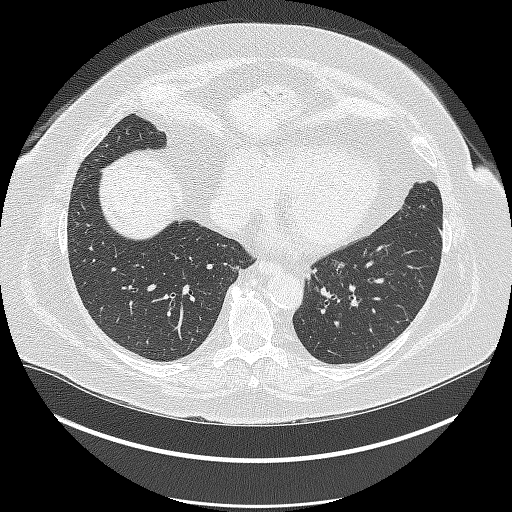
[im 166/349  lung]
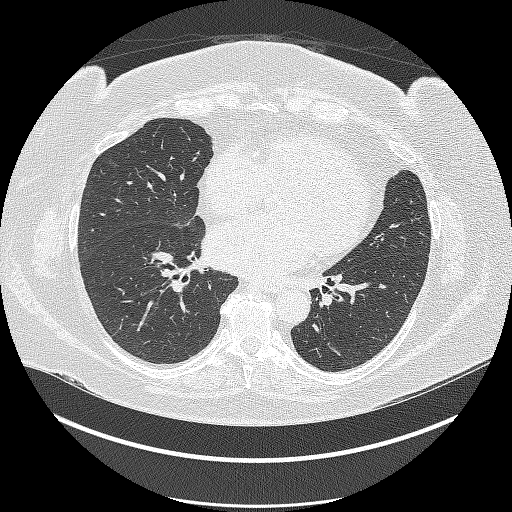
[im 183/349  lung]
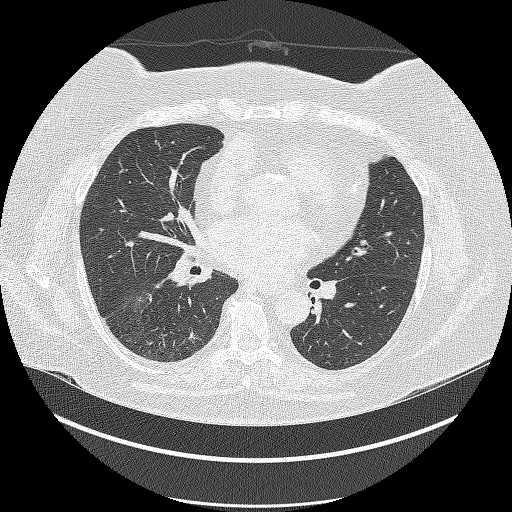
[im 216/349  lung]
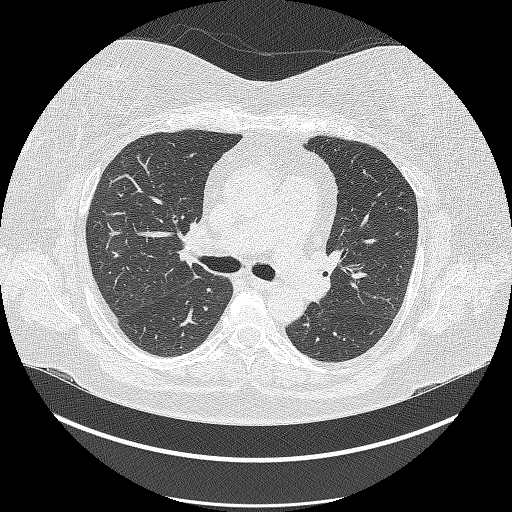
[im 249/349  mediastinal]
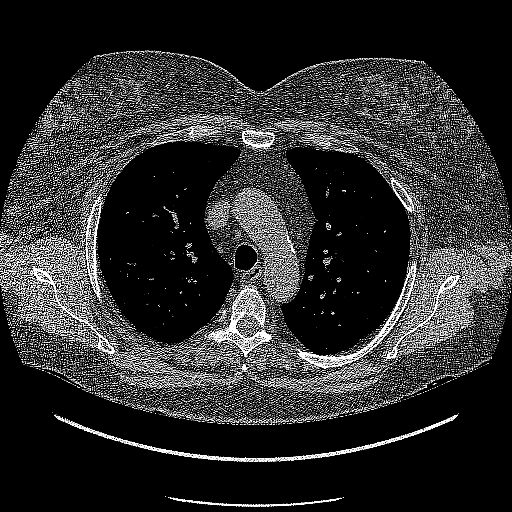
[im 249/349  lung]
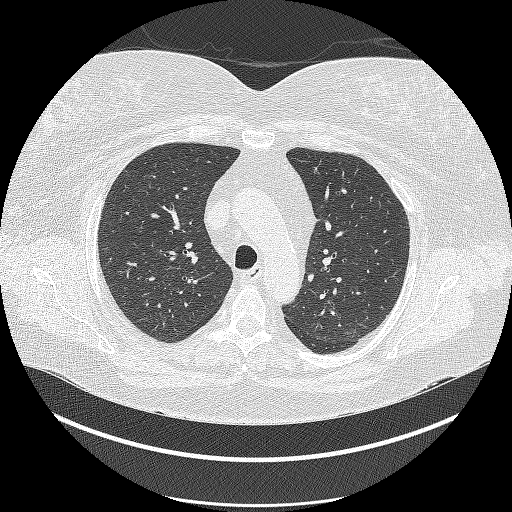
[im 266/349  lung]
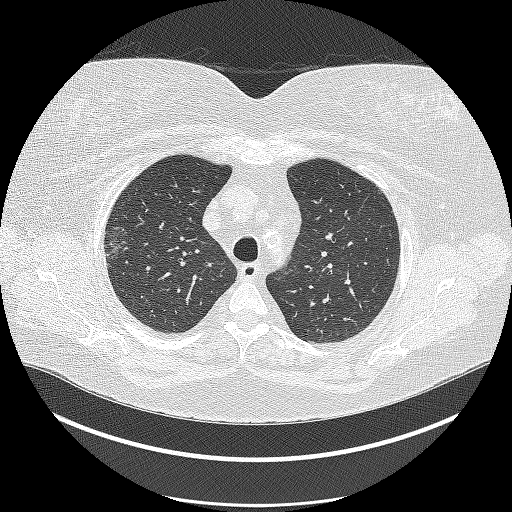
[im 299/349  lung]
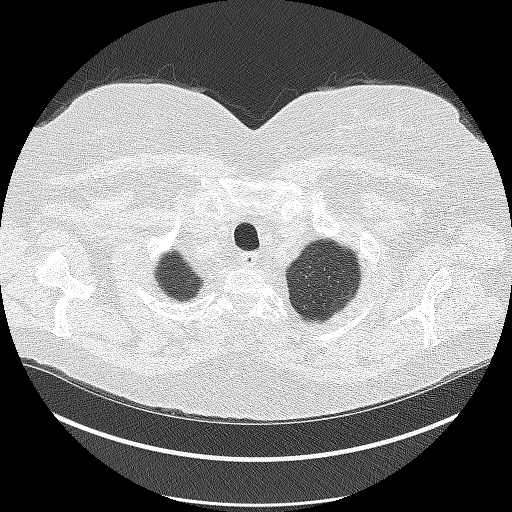
[im 332/349  lung]
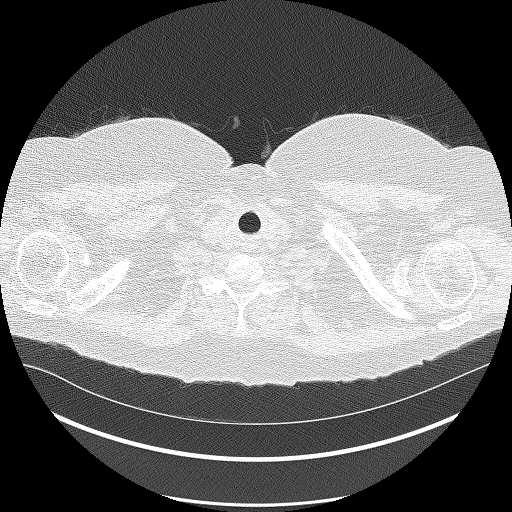

[15 of 40 positions shown; findings below may reference images not displayed]

FINDINGS: Cardiovascular: Normal heart size. Aortic atherosclerosis.
Calcifications in the RCA, LAD and left circumflex coronary arteries
noted.

Mediastinum/Nodes: No enlarged mediastinal, hilar, or axillary lymph
nodes. Thyroid gland, trachea, and esophagus demonstrate no
significant findings.

Lungs/Pleura: Small noncalcified pulmonary nodules are noted in both
lungs. The largest is in the right upper lobe with an equivalent
diameter 4.9 mm. Mild changes of emphysema. No pleural effusion.

Upper Abdomen: No acute abnormality.

Musculoskeletal: Spondylosis noted within the thoracic spine. No
suspicious bone lesions.
IMPRESSION: 1. Lung-RADS 2, benign appearance or behavior. Continue annual
screening with low-dose chest CT without contrast in 12 months.
2. Aortic Atherosclerosis (GQ3L7-09I.I) and Emphysema (GQ3L7-JI8.G).
3. Multi vessel coronary artery atherosclerotic calcifications.

## 2020-02-03 DIAGNOSIS — Z23 Encounter for immunization: Secondary | ICD-10-CM | POA: Diagnosis not present

## 2020-02-03 DIAGNOSIS — E1142 Type 2 diabetes mellitus with diabetic polyneuropathy: Secondary | ICD-10-CM | POA: Diagnosis not present

## 2020-03-07 NOTE — Progress Notes (Deleted)
Date:  03/07/2020   ID:  Linda Ruiz, DOB Apr 15, 1950, MRN [id number]  Provider Location: Office  PCP:  Burnard Bunting, MD  Cardiologist:  Johnsie Cancel Electrophysiologist:  None   Evaluation Performed:  Follow-Up Visit  Chief Complaint:  Afib  History of Present Illness:    Linda Ruiz is a 70 y.o. female with chronic afib. Finally transitioned to eliquis from coumadin May 2020. Still smoking History of HTN and HLD Last echo 10/15/16 EF 60-65% ,no significant valve disease Myovue normal 10/15/16 EF 73% no ischemia. Working from home at [employer] Daughter works at [employer]. Both parents died of lung cancer but she is unwilling to quit Lung cancer screening CT 04/06/19 no cancer.  She indicates retiring in March from [place] Husband home use to run [place]  Dr Reynaldo Minium changing her BP meds to start Benicar and stop accupril ***  ***   The patient does not have symptoms concerning for COVID-19 infection (fever, chills, cough, or new shortness of breath).    Past Medical History:  Diagnosis Date  . Anxiety   . Atrial fibrillation ([place])   . Cataract    bilateral,starting  . Colon polyps   . Depression   . Diverticulosis   . Fracture of wrist, closed 08/2012   Left  . Hyperlipidemia   . Hypertension   . Migraine   . Skin cancer   . Sleep apnea    uses cpap  . 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 Wears dentures    top  . Wears glasses    Past Surgical History:  Procedure Laterality Date  . BUNIONECTOMY  05/2010   Left  . CARPAL TUNNEL RELEASE Left 01/19/2013   Procedure: LEFT CARPAL TUNNEL RELEASE;  Surgeon: Tennis Must, MD;  Location: [place];  Service: Orthopedics;  Laterality: Left;  . CESAREAN SECTION    . COLONOSCOPY    . DILATION AND CURETTAGE OF UTERUS  2005   endometrial polyp removed   . OPEN REDUCTION INTERNAL FIXATION (ORIF) DISTAL RADIAL FRACTURE Left 09/08/2012   Procedure: OPEN  REDUCTION INTERNAL FIXATION (ORIF) DISTAL RADIAL FRACTURE;  Surgeon: Tennis Must, MD;  Location: [place];  Service: Orthopedics;  Laterality: Left;  . POLYPECTOMY    . SKIN CANCER EXCISION    . TUBAL LIGATION       No outpatient medications have been marked as taking for the 03/13/20 encounter (Appointment) with Josue Hector, MD.     Allergies:   Sulfa antibiotics, Sulfasalazine, and Sulfonamide derivatives   Social History   Tobacco Use  . Smoking status: Current Every Day Smoker    Years: 8.00    Types: Cigarettes  . Smokeless tobacco: Never Used  . Tobacco comment: 3 a day  Vaping Use  . Vaping Use: Never used  Substance Use Topics  . Alcohol use: Yes    Alcohol/week: 0.0 standard drinks    Comment: occasional  . Drug use: No     Family Hx: The patient's family history includes Breast cancer in her paternal grandmother; COPD in her sister; Emphysema in her sister; Lung cancer in her father and mother; Stomach cancer in her mother. There is no history of Colon cancer, Esophageal cancer, or Rectal cancer.  ROS:   Please see the history of present illness.     All other systems reviewed and are negative.   Prior CV studies:  The following studies were reviewed today:  TTE 10/15/16 Myovue 10/15/16 PFT;s 10/25/16  Minimal small airway dx CXR 09/26/16 mild CE NAD Lung cancer screening CT 04/06/19 no lesions   Labs/Other Tests and Data Reviewed:    EKG:  03/16/19  afib rate 55 low voltage   Recent Labs: No results found for requested labs within last 8760 hours.   Recent Lipid Panel Lab Results  Component Value Date/Time   CHOL 192 04/06/2008 09:23 AM   TRIG 151 (H) 04/06/2008 09:23 AM   HDL 37.1 (L) 04/06/2008 09:23 AM   CHOLHDL 5.2 CALC 04/06/2008 09:23 AM   LDLCALC 125 (H) 04/06/2008 09:23 AM    Wt Readings from Last 3 Encounters:  03/16/19 94.1 kg  07/16/18 93 kg  07/03/17 94.6 kg     Objective:    Vital Signs:  LMP 10/27/2006     Telephone visit no exam   ASSESSMENT & PLAN:    1. Afib: chronic good rate control and anticoagulation continue eliquis and Toprol 2. HTN: F/U Reynaldo Minium now on Toprol, [place] *** 3. HLD  Continue lipitor labs with Dr Reynaldo Minium 4. Smoking counseled on cessation Lung cancer screening CT negative 04/06/19 repeat this February  5. Depression:  On Zoloft stable   COVID-19 Education: The signs and symptoms of COVID-19 were discussed with the patient and how to seek care for testing (follow up with PCP or arrange E-visit).  The importance of social distancing was discussed today.   Medication Adjustments/Labs and Tests Ordered: Current medicines are reviewed at length with the patient today.  Concerns regarding medicines are outlined above.   Tests Ordered:  Lung cancer screening CT 03/2020  Medication Changes: No orders of the defined types were placed in this encounter.   Disposition:  Follow up in a year  Signed, Jenkins Rouge, MD  03/07/2020 6:07 PM    [place]

## 2020-03-13 ENCOUNTER — Ambulatory Visit: Payer: Managed Care, Other (non HMO) | Admitting: Cardiovascular Disease

## 2020-03-22 DIAGNOSIS — E785 Hyperlipidemia, unspecified: Secondary | ICD-10-CM | POA: Diagnosis not present

## 2020-03-23 ENCOUNTER — Other Ambulatory Visit: Payer: Self-pay | Admitting: Cardiovascular Disease

## 2020-03-23 NOTE — Telephone Encounter (Signed)
Prescription refill request for Eliquis received.  Indication: afib Last office visit: Nishan, 04/07/2019 Scr: 0.9, 03/30/2019 Age: 70 yo  Weight: 94.1 kg   Prescription refill sent.

## 2020-03-29 DIAGNOSIS — M25551 Pain in right hip: Secondary | ICD-10-CM | POA: Diagnosis not present

## 2020-03-29 DIAGNOSIS — R82998 Other abnormal findings in urine: Secondary | ICD-10-CM | POA: Diagnosis not present

## 2020-03-29 DIAGNOSIS — R319 Hematuria, unspecified: Secondary | ICD-10-CM | POA: Diagnosis not present

## 2020-03-29 DIAGNOSIS — Z0000 Encounter for general adult medical examination without abnormal findings: Secondary | ICD-10-CM | POA: Diagnosis not present

## 2020-03-29 DIAGNOSIS — J449 Chronic obstructive pulmonary disease, unspecified: Secondary | ICD-10-CM | POA: Diagnosis not present

## 2020-03-29 DIAGNOSIS — E669 Obesity, unspecified: Secondary | ICD-10-CM | POA: Diagnosis not present

## 2020-03-29 DIAGNOSIS — F329 Major depressive disorder, single episode, unspecified: Secondary | ICD-10-CM | POA: Diagnosis not present

## 2020-03-29 DIAGNOSIS — M545 Low back pain, unspecified: Secondary | ICD-10-CM | POA: Diagnosis not present

## 2020-03-29 DIAGNOSIS — I482 Chronic atrial fibrillation, unspecified: Secondary | ICD-10-CM | POA: Diagnosis not present

## 2020-03-29 DIAGNOSIS — M4800 Spinal stenosis, site unspecified: Secondary | ICD-10-CM | POA: Diagnosis not present

## 2020-03-29 DIAGNOSIS — E039 Hypothyroidism, unspecified: Secondary | ICD-10-CM | POA: Diagnosis not present

## 2020-03-29 DIAGNOSIS — I10 Essential (primary) hypertension: Secondary | ICD-10-CM | POA: Diagnosis not present

## 2020-03-29 DIAGNOSIS — E785 Hyperlipidemia, unspecified: Secondary | ICD-10-CM | POA: Diagnosis not present

## 2020-03-29 DIAGNOSIS — M5136 Other intervertebral disc degeneration, lumbar region: Secondary | ICD-10-CM | POA: Diagnosis not present

## 2020-03-30 DIAGNOSIS — Z1212 Encounter for screening for malignant neoplasm of rectum: Secondary | ICD-10-CM | POA: Diagnosis not present

## 2020-04-06 ENCOUNTER — Encounter: Payer: Self-pay | Admitting: Gastroenterology

## 2020-04-12 ENCOUNTER — Other Ambulatory Visit: Payer: Self-pay

## 2020-04-12 ENCOUNTER — Ambulatory Visit (INDEPENDENT_AMBULATORY_CARE_PROVIDER_SITE_OTHER): Payer: Self-pay | Admitting: Cardiology

## 2020-04-12 ENCOUNTER — Encounter: Payer: Self-pay | Admitting: Cardiology

## 2020-04-12 VITALS — BP 116/64 | HR 68 | Ht 62.0 in | Wt 187.0 lb

## 2020-04-12 DIAGNOSIS — I10 Essential (primary) hypertension: Secondary | ICD-10-CM

## 2020-04-12 DIAGNOSIS — Z5181 Encounter for therapeutic drug level monitoring: Secondary | ICD-10-CM

## 2020-04-12 DIAGNOSIS — Z87891 Personal history of nicotine dependence: Secondary | ICD-10-CM

## 2020-04-12 DIAGNOSIS — I4821 Permanent atrial fibrillation: Secondary | ICD-10-CM

## 2020-04-12 NOTE — Patient Instructions (Signed)
Medication Instructions:  Your physician recommends that you continue on your current medications as directed. Please refer to the Current Medication list given to you today.  *If you need a refill on your cardiac medications before your next appointment, please call your pharmacy*   Lab Work: NONE If you have labs (blood work) drawn today and your tests are completely normal, you will receive your results only by: Marland Kitchen MyChart Message (if you have MyChart) OR . A paper copy in the mail If you have any lab test that is abnormal or we need to change your treatment, we will call you to review the results.   Testing/Procedures: NONE   Follow-Up: At [facility], you and your health needs are our priority.  As part of our continuing mission to provide you with exceptional heart care, we have created designated Provider Care Teams.  These Care Teams include your primary Cardiologist (physician) and Advanced Practice Providers (APPs -  Physician Assistants and Nurse Practitioners) who all work together to provide you with the care you need, when you need it.  We recommend signing up for the patient portal called "MyChart".  Sign up information is provided on this After Visit Summary.  MyChart is used to connect with patients for Virtual Visits (Telemedicine).  Patients are able to view lab/test results, encounter notes, upcoming appointments, etc.  Non-urgent messages can be sent to your provider as well.   To learn more about what you can do with MyChart, go to [email or web address].    Your next appointment:   12 month(s)  The format for your next appointment:   In Person  Provider:   You may see DR. NISHAN or one of the following Advanced Practice Providers on your designated Care Team:    Kathyrn Drown, NP

## 2020-04-12 NOTE — Progress Notes (Signed)
Cardiology Office Note   Date:  04/12/2020   ID:  [name], Linda Ruiz May 02, 1950, MRN [id number]  PCP:  Burnard Bunting, MD  Cardiologist:  Dr. Johnsie Cancel, MD  Chief Complaint  Patient presents with  . Follow-up    History of Present Illness: Linda Ruiz is a 70 y.o. female who presents for follow-up, seen for Dr. Johnsie Cancel.   Ms. Munsch has a history of chronic atrial fibrillation transitioned from Coumadin to Eliquis 06/2018, tobacco use, HTN and HLD.  Echocardiogram 09/2016 with EF at 60 to 65% with mild LAE and no significant valve disease. Myoview stress test 09/2016 were normal with no ischemia or infarct.  Today she is here for follow up and reports she has been doing well from a CV standpoint however says that her husband of 37years recently left her in January so it has been quite and adjustment. Her daughter is living with her right now and she has a sister close by for support. She quit smoking for a month prior to that however resumed with the increased stress. AF remains rate controlled. PCP added Benicar to her regimen because she was having increased BPs with HAs. Sine the addition, BPs have been great. She denies chest pain, palpitations, DOE, SOB, LE edema, orthopnea, or syncope. Tolerating meds well. Labs done last month with PCP. All looks good.     Past Medical History:  Diagnosis Date  . Anxiety   . Atrial fibrillation ([place])   . Cataract    bilateral,starting  . Colon polyps   . Depression   . Diverticulosis   . Fracture of wrist, closed 08/2012   Left  . Hyperlipidemia   . Hypertension   . Migraine   . Skin cancer   . Sleep apnea    uses cpap  . 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 Wears dentures    top  . Wears glasses     Past Surgical History:  Procedure Laterality Date  . BUNIONECTOMY  05/2010   Left  . CARPAL TUNNEL RELEASE Left 01/19/2013   Procedure: LEFT CARPAL TUNNEL RELEASE;  Surgeon: Tennis Must, MD;  Location: [facility];  Service: Orthopedics;  Laterality: Left;  . CESAREAN SECTION    . COLONOSCOPY    . DILATION AND CURETTAGE OF UTERUS  2005   endometrial polyp removed   . OPEN REDUCTION INTERNAL FIXATION (ORIF) DISTAL RADIAL FRACTURE Left 09/08/2012   Procedure: OPEN REDUCTION INTERNAL FIXATION (ORIF) DISTAL RADIAL FRACTURE;  Surgeon: Tennis Must, MD;  Location: [facility];  Service: Orthopedics;  Laterality: Left;  . POLYPECTOMY    . SKIN CANCER EXCISION    . TUBAL LIGATION       Current Outpatient Medications  Medication Sig Dispense Refill  . atorvastatin (LIPITOR) 20 MG tablet Take 20 mg by mouth daily.     Marland Kitchen ELIQUIS 5 MG TABS tablet TAKE 1 TABLET(5 MG) BY MOUTH TWICE DAILY 180 tablet 1  . levothyroxine (SYNTHROID) 50 MCG tablet Take 50 mcg by mouth every morning.    . metoprolol succinate (TOPROL-XL) 50 MG 24 hr tablet TAKE 1 TABLET BY MOUTH EVERY DAY WITH FOOD 30 tablet 11  . olmesartan (BENICAR) 40 MG tablet Take 1 tablet by mouth daily.    . sertraline (ZOLOFT) 50 MG tablet Take 50 mg by mouth at bedtime.    . verapamil (CALAN-SR) 240 MG CR tablet TAKE 1  TABLET(240 MG) BY MOUTH AT BEDTIME 90 tablet 0   No current facility-administered medications for this visit.    Allergies:   Sulfa antibiotics, Sulfasalazine, and Sulfonamide derivatives    Social History:  The patient  reports that she has been smoking cigarettes. She has smoked for the past 8.00 years. She has never used smokeless tobacco. She reports current alcohol use. She reports that she does not use drugs.   Family History:  The patient's family history includes Breast cancer in her paternal grandmother; COPD in her sister; Emphysema in her sister; Lung cancer in her father and mother; Stomach cancer in her mother.    ROS:  Please see the history of present illness.   Otherwise, review of systems are positive for none.  All other systems are reviewed and negative.     PHYSICAL EXAM: VS:  BP 116/64   Pulse 68   Ht 5\' 2"  (1.575 m)   Wt 187 lb (84.8 kg)   LMP 10/27/2006   SpO2 95%   BMI 34.20 kg/m  , BMI Body mass index is 34.2 kg/m.   General: Well developed, well nourished, NAD Lungs:Clear to ausculation bilaterallyBreathing is unlabored. Cardiovascular: Irregularly irregular. No murmurs Extremities: No edema. Radial pulses 2+ bilaterally Neuro: Alert and oriented. No focal deficits. No facial asymmetry. MAE spontaneously. Psych: Responds to questions appropriately with normal affect.    EKG:  EKG is not ordered today.  Recent Labs: No results found for requested labs within last 8760 hours.    Lipid Panel    Component Value Date/Time   CHOL 192 04/06/2008 0923   TRIG 151 (H) 04/06/2008 0923   HDL 37.1 (L) 04/06/2008 0923   CHOLHDL 5.2 CALC 04/06/2008 0923   VLDL 30 04/06/2008 0923   LDLCALC 125 (H) 04/06/2008 0923     Wt Readings from Last 3 Encounters:  04/12/20 187 lb (84.8 kg)  03/16/19 207 lb 6.4 oz (94.1 kg)  07/16/18 205 lb (93 kg)    Other studies Reviewed: Additional studies/ records that were reviewed today include: Review of the above records demonstrates:   Stress test 10/15/2016:   Nuclear stress EF: 73%.  The left ventricular ejection fraction is hyperdynamic (>65%).  There was no ST segment deviation noted during stress.  This is a low risk study.   1. EF 73%, normal wall motion.  2. Fixed small, mild basal anterolateral perfusion defect.  Given normal wall motion, this may represent attenuation.  No ischemia.   Low risk study.   Echocardiogram 10/15/2016:   Nuclear stress EF: 73%.  The left ventricular ejection fraction is hyperdynamic (>65%).  There was no ST segment deviation noted during stress.  This is a low risk study.   1. EF 73%, normal wall motion.  2. Fixed small, mild basal anterolateral perfusion defect.  Given normal wall motion, this may represent attenuation.  No ischemia.    Low risk study.   ASSESSMENT AND PLAN:  1. Chronic atrial fibrillation: -Rate controlled with Toprol-XL, vermapamil. -Anticoagulated with Eliquis with no signs or symptoms of bleeding in stool or urine  2. HTN: -Follows with PCP -Transitioned from Accupril to Benicar -Stable, 116/64 -Labs stable from last month   3. Tobacco use: -Cessation strongly encouraged>>quit for a month however with increased stress of separation she returned to smoking  -Last lung cancer screening CT 2021>>>offered repeat CT screen however patient deferred   4. Depression: -Stable on Zoloft -Per PCP  Current medicines are reviewed at length with the  patient today.  The patient does not have concerns regarding medicines.  The following changes have been made:  no change  Labs/ tests ordered today include: None   No orders of the defined types were placed in this encounter.   Disposition:   FU with Dr. Johnsie Cancel in 1 year  Signed, Kathyrn Drown, NP  04/12/2020 2:16 PM    [facility] [place], [place],   [address] Phone: [phone]; Fax: [phone]

## 2020-05-02 ENCOUNTER — Other Ambulatory Visit: Payer: Self-pay | Admitting: Cardiovascular Disease

## 2020-06-14 ENCOUNTER — Other Ambulatory Visit: Payer: Self-pay | Admitting: Cardiovascular Disease

## 2020-06-15 DIAGNOSIS — L988 Other specified disorders of the skin and subcutaneous tissue: Secondary | ICD-10-CM | POA: Diagnosis not present

## 2020-06-15 DIAGNOSIS — D1801 Hemangioma of skin and subcutaneous tissue: Secondary | ICD-10-CM | POA: Diagnosis not present

## 2020-06-15 DIAGNOSIS — D485 Neoplasm of uncertain behavior of skin: Secondary | ICD-10-CM | POA: Diagnosis not present

## 2020-06-15 DIAGNOSIS — L821 Other seborrheic keratosis: Secondary | ICD-10-CM | POA: Diagnosis not present

## 2020-07-18 DIAGNOSIS — L988 Other specified disorders of the skin and subcutaneous tissue: Secondary | ICD-10-CM | POA: Diagnosis not present

## 2020-07-18 DIAGNOSIS — D485 Neoplasm of uncertain behavior of skin: Secondary | ICD-10-CM | POA: Diagnosis not present

## 2020-09-11 ENCOUNTER — Other Ambulatory Visit: Payer: Self-pay | Admitting: Cardiovascular Disease

## 2020-09-11 NOTE — Telephone Encounter (Signed)
Pt's age 70, wt 84.8 kg, SCr 1.0, CrCl 67.07, last ov w/ JM 04/12/20.

## 2020-10-18 DIAGNOSIS — I10 Essential (primary) hypertension: Secondary | ICD-10-CM | POA: Diagnosis not present

## 2020-10-18 DIAGNOSIS — E669 Obesity, unspecified: Secondary | ICD-10-CM | POA: Diagnosis not present

## 2020-10-18 DIAGNOSIS — M5136 Other intervertebral disc degeneration, lumbar region: Secondary | ICD-10-CM | POA: Diagnosis not present

## 2020-10-18 DIAGNOSIS — I482 Chronic atrial fibrillation, unspecified: Secondary | ICD-10-CM | POA: Diagnosis not present

## 2020-10-18 DIAGNOSIS — J449 Chronic obstructive pulmonary disease, unspecified: Secondary | ICD-10-CM | POA: Diagnosis not present

## 2020-10-18 DIAGNOSIS — F329 Major depressive disorder, single episode, unspecified: Secondary | ICD-10-CM | POA: Diagnosis not present

## 2020-10-18 DIAGNOSIS — M25551 Pain in right hip: Secondary | ICD-10-CM | POA: Diagnosis not present

## 2020-10-18 DIAGNOSIS — M545 Low back pain, unspecified: Secondary | ICD-10-CM | POA: Diagnosis not present

## 2020-10-23 DIAGNOSIS — L720 Epidermal cyst: Secondary | ICD-10-CM | POA: Diagnosis not present

## 2020-10-23 DIAGNOSIS — L820 Inflamed seborrheic keratosis: Secondary | ICD-10-CM | POA: Diagnosis not present

## 2020-12-23 ENCOUNTER — Other Ambulatory Visit: Payer: Self-pay | Admitting: Cardiovascular Disease

## 2020-12-25 NOTE — Telephone Encounter (Signed)
Eliquis 5mg  refill request received. Patient is 70 years old, weight-84.8kg, Crea-1.00 on 10/20/2020 via [facility], and last seen by Sharee Pimple, NP on 04/12/2020. Dose is appropriate based on dosing criteria. Will send in refill to requested pharmacy.

## 2021-02-23 DIAGNOSIS — E785 Hyperlipidemia, unspecified: Secondary | ICD-10-CM | POA: Diagnosis not present

## 2021-02-23 DIAGNOSIS — I10 Essential (primary) hypertension: Secondary | ICD-10-CM | POA: Diagnosis not present

## 2021-02-23 DIAGNOSIS — I482 Chronic atrial fibrillation, unspecified: Secondary | ICD-10-CM | POA: Diagnosis not present

## 2021-02-23 DIAGNOSIS — J449 Chronic obstructive pulmonary disease, unspecified: Secondary | ICD-10-CM | POA: Diagnosis not present

## 2021-03-14 ENCOUNTER — Other Ambulatory Visit: Payer: Self-pay | Admitting: Cardiovascular Disease

## 2021-03-22 ENCOUNTER — Other Ambulatory Visit: Payer: Self-pay | Admitting: Cardiovascular Disease

## 2021-03-25 DIAGNOSIS — J449 Chronic obstructive pulmonary disease, unspecified: Secondary | ICD-10-CM | POA: Diagnosis not present

## 2021-03-25 DIAGNOSIS — E785 Hyperlipidemia, unspecified: Secondary | ICD-10-CM | POA: Diagnosis not present

## 2021-03-25 DIAGNOSIS — I482 Chronic atrial fibrillation, unspecified: Secondary | ICD-10-CM | POA: Diagnosis not present

## 2021-03-25 DIAGNOSIS — I10 Essential (primary) hypertension: Secondary | ICD-10-CM | POA: Diagnosis not present

## 2021-03-26 DIAGNOSIS — E039 Hypothyroidism, unspecified: Secondary | ICD-10-CM | POA: Diagnosis not present

## 2021-03-26 DIAGNOSIS — E785 Hyperlipidemia, unspecified: Secondary | ICD-10-CM | POA: Diagnosis not present

## 2021-03-26 DIAGNOSIS — I10 Essential (primary) hypertension: Secondary | ICD-10-CM | POA: Diagnosis not present

## 2021-03-26 DIAGNOSIS — Z780 Asymptomatic menopausal state: Secondary | ICD-10-CM | POA: Diagnosis not present

## 2021-04-16 DIAGNOSIS — R82998 Other abnormal findings in urine: Secondary | ICD-10-CM | POA: Diagnosis not present

## 2021-04-16 DIAGNOSIS — E669 Obesity, unspecified: Secondary | ICD-10-CM | POA: Diagnosis not present

## 2021-04-16 DIAGNOSIS — Z720 Tobacco use: Secondary | ICD-10-CM | POA: Diagnosis not present

## 2021-04-16 DIAGNOSIS — Z1339 Encounter for screening examination for other mental health and behavioral disorders: Secondary | ICD-10-CM | POA: Diagnosis not present

## 2021-04-16 DIAGNOSIS — I700 Atherosclerosis of aorta: Secondary | ICD-10-CM | POA: Diagnosis not present

## 2021-04-16 DIAGNOSIS — Z1331 Encounter for screening for depression: Secondary | ICD-10-CM | POA: Diagnosis not present

## 2021-04-16 DIAGNOSIS — Z7901 Long term (current) use of anticoagulants: Secondary | ICD-10-CM | POA: Diagnosis not present

## 2021-04-16 DIAGNOSIS — I10 Essential (primary) hypertension: Secondary | ICD-10-CM | POA: Diagnosis not present

## 2021-04-16 DIAGNOSIS — E785 Hyperlipidemia, unspecified: Secondary | ICD-10-CM | POA: Diagnosis not present

## 2021-04-16 DIAGNOSIS — J449 Chronic obstructive pulmonary disease, unspecified: Secondary | ICD-10-CM | POA: Diagnosis not present

## 2021-04-16 DIAGNOSIS — I482 Chronic atrial fibrillation, unspecified: Secondary | ICD-10-CM | POA: Diagnosis not present

## 2021-04-16 DIAGNOSIS — Z0000 Encounter for general adult medical examination without abnormal findings: Secondary | ICD-10-CM | POA: Diagnosis not present

## 2021-05-22 DIAGNOSIS — M4800 Spinal stenosis, site unspecified: Secondary | ICD-10-CM | POA: Diagnosis not present

## 2021-05-22 DIAGNOSIS — M5417 Radiculopathy, lumbosacral region: Secondary | ICD-10-CM | POA: Diagnosis not present

## 2021-06-09 ENCOUNTER — Other Ambulatory Visit: Payer: Self-pay | Admitting: Cardiovascular Disease

## 2021-06-13 ENCOUNTER — Other Ambulatory Visit: Payer: Self-pay | Admitting: Cardiovascular Disease

## 2021-06-18 DIAGNOSIS — L7211 Pilar cyst: Secondary | ICD-10-CM | POA: Diagnosis not present

## 2021-06-18 DIAGNOSIS — D2372 Other benign neoplasm of skin of left lower limb, including hip: Secondary | ICD-10-CM | POA: Diagnosis not present

## 2021-06-18 DIAGNOSIS — D485 Neoplasm of uncertain behavior of skin: Secondary | ICD-10-CM | POA: Diagnosis not present

## 2021-06-18 DIAGNOSIS — L821 Other seborrheic keratosis: Secondary | ICD-10-CM | POA: Diagnosis not present

## 2021-06-18 DIAGNOSIS — L720 Epidermal cyst: Secondary | ICD-10-CM | POA: Diagnosis not present

## 2021-06-18 DIAGNOSIS — L820 Inflamed seborrheic keratosis: Secondary | ICD-10-CM | POA: Diagnosis not present

## 2021-06-23 NOTE — Progress Notes (Signed)
? ?  ?Cardiology Office Note ? ? ?Date:  06/25/2021  ? ?ID:  SOREN PIGMAN, DOB Jan 02, 1951, MRN [id number] ? ?PCP:  Burnard Bunting, MD  ?Cardiologist:  Dr. Johnsie Cancel, MD ? ?No chief complaint on file. ? ? ?History of Present Illness: ?Linda Ruiz is a 71 y.o. female who presents for follow-up of chronic atrial fibrillation transitioned from Coumadin to Eliquis 06/2018, tobacco use, HTN and HLD. ? ?Echocardiogram 09/2016 with EF at 60 to 65% with mild LAE and no significant valve disease. Myoview stress test 09/2016 were normal with no ischemia or infarct. ? ?Husband of 37years  left her in January 2022  so it has been quite and adjustment. Her daughter is living with her right now and she has a sister close by for support. She quit smoking for a month prior to that however resumed with the increased stress. AF remains rate controlled. PCP added Benicar to her regimen because she was having increased BPs with HAs. Sine the addition, BPs have been great. She denies chest pain, palpitations, DOE, SOB, LE edema, orthopnea, or syncope.  ? ?Has lost about 25 lbs and feels better No cardiac issues  ?Seeing Reynaldo Minium in August and told her to have him order Lung cancer CT  ? ? ?Past Medical History:  ?Diagnosis Date  ? Anxiety   ? Atrial fibrillation ([place])   ? Cataract   ? bilateral,starting  ? Colon polyps   ? Depression   ? Diverticulosis   ? Fracture of wrist, closed 08/2012  ? Left  ? Hyperlipidemia   ? Hypertension   ? Migraine   ? Skin cancer   ? Sleep apnea   ? uses cpap  ? 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 vin 3 with biopsy  ? Wears dentures   ? top  ? Wears glasses   ? ? ?Past Surgical History:  ?Procedure Laterality Date  ? BUNIONECTOMY  05/2010  ? Left  ? CARPAL TUNNEL RELEASE Left 01/19/2013  ? Procedure: LEFT CARPAL TUNNEL RELEASE;  Surgeon: Tennis Must, MD;  Location: [facility];  Service: Orthopedics;  Laterality: Left;  ? CESAREAN SECTION    ? COLONOSCOPY    ? DILATION AND  CURETTAGE OF UTERUS  2005  ? endometrial polyp removed   ? OPEN REDUCTION INTERNAL FIXATION (ORIF) DISTAL RADIAL FRACTURE Left 09/08/2012  ? Procedure: OPEN REDUCTION INTERNAL FIXATION (ORIF) DISTAL RADIAL FRACTURE;  Surgeon: Tennis Must, MD;  Location: [facility];  Service: Orthopedics;  Laterality: Left;  ? POLYPECTOMY    ? SKIN CANCER EXCISION    ? TUBAL LIGATION    ? ? ? ?Current Outpatient Medications  ?Medication Sig Dispense Refill  ? atorvastatin (LIPITOR) 20 MG tablet Take 20 mg by mouth daily.     ? ELIQUIS 5 MG TABS tablet TAKE 1 TABLET(5 MG) BY MOUTH TWICE DAILY 180 tablet 1  ? levothyroxine (SYNTHROID) 50 MCG tablet Take 50 mcg by mouth every morning.    ? metoprolol succinate (TOPROL-XL) 50 MG 24 hr tablet TAKE 1 TABLET BY MOUTH EVERY DAY WITH FOOD 30 tablet 1  ? olmesartan (BENICAR) 40 MG tablet Take 1 tablet by mouth daily.    ? sertraline (ZOLOFT) 50 MG tablet Take 50 mg by mouth at bedtime.    ? verapamil (CALAN-SR) 240 MG CR tablet Take 1 tablet (240 mg total) by mouth daily. Pt. Needs to make appt. With Dr. Johnsie Cancel is order to receive further refills.  Thank You. 1st Attempt. 30 tablet 0  ? ?No current facility-administered medications for this visit.  ? ? ?Allergies:   Sulfa antibiotics, Sulfasalazine, and Sulfonamide derivatives  ? ? ?Social History:  The patient  reports that she has been smoking cigarettes. She has never used smokeless tobacco. She reports current alcohol use. She reports that she does not use drugs.  ? ?Family History:  The patient's family history includes Breast cancer in her paternal grandmother; COPD in her sister; Emphysema in her sister; Lung cancer in her father and mother; Stomach cancer in her mother.  ? ? ?ROS:  Please see the history of present illness.   Otherwise, review of systems are positive for none.  All other systems are reviewed and negative.  ? ? ?PHYSICAL EXAM: ?VS:  BP 110/72   Pulse 69   Ht '5\' 2"'$  (2.426 m)   Wt 174 lb (78.9 kg)   LMP  10/27/2006   SpO2 97%   BMI 31.83 kg/m?  , BMI Body mass index is 31.83 kg/m?.  ? ?Affect appropriate ?Healthy:  appears stated age ?HEENT: normal ?Neck supple with no adenopathy ?JVP normal no bruits no thyromegaly ?Lungs clear with no wheezing and good diaphragmatic motion ?Heart:  S1/S2 no murmur, no rub, gallop or click ?PMI normal ?Abdomen: benighn, BS positve, no tenderness, no AAA ?no bruit.  No HSM or HJR ?Distal pulses intact with no bruits ?No edema ?Neuro non-focal ?Skin warm and dry ?No muscular weakness ?.   ? ?EKG:   Afib rate 55 nonspecific ST changes 03/16/19  06/25/2021 afib rate 69 nonspecific ST changes  ? ? ?Recent Labs: ?No results found for requested labs within last 8760 hours.  ? ? ?Lipid Panel ?   ?Component Value Date/Time  ? CHOL 192 04/06/2008 0923  ? TRIG 151 (H) 04/06/2008 8341  ? HDL 37.1 (L) 04/06/2008 0923  ? CHOLHDL 5.2 CALC 04/06/2008 0923  ? VLDL 30 04/06/2008 0923  ? [place] 125 (H) 04/06/2008 9622  ? ?  ?Wt Readings from Last 3 Encounters:  ?06/25/21 174 lb (78.9 kg)  ?04/12/20 187 lb (84.8 kg)  ?03/16/19 207 lb 6.4 oz (94.1 kg)  ?  ?Other studies Reviewed: ?Additional studies/ records that were reviewed today include: ?Review of the above records demonstrates:  ? ?Stress test 10/15/2016: ? ?Nuclear stress EF: 73%. ?The left ventricular ejection fraction is hyperdynamic (>65%). ?There was no ST segment deviation noted during stress. ?This is a low risk study. ?  ?1. EF 73%, normal wall motion.  ?2. Fixed small, mild basal anterolateral perfusion defect.  Given normal wall motion, this may represent attenuation.  No ischemia.  ?  ?Low risk study.  ? ?Echocardiogram 10/15/2016: ? ?Nuclear stress EF: 73%. ?The left ventricular ejection fraction is hyperdynamic (>65%). ?There was no ST segment deviation noted during stress. ?This is a low risk study. ?  ?1. EF 73%, normal wall motion.  ?2. Fixed small, mild basal anterolateral perfusion defect.  Given normal wall motion, this may  represent attenuation.  No ischemia.  ?  ?Low risk study.  ? ?ASSESSMENT AND PLAN: ? ?1. Chronic atrial fibrillation: ?-Rate controlled with Toprol-XL, vermapamil. -Anticoagulated with Eliquis with no signs or symptoms of bleeding in stool or urine ? ?2. HTN: ?-Follows with PCP ?-Benicar ?-f/u primary   ? ?3. Tobacco use: ?-Cessation strongly encouraged>>quit for a month however with increased stress of separation she returned to smoking  ?-Last lung cancer screening CT 2021>>>offered repeat CT screen and we  will order it ? ?4. Depression: ?-Stable on Zoloft ?-Per PCP ? ?Current medicines are reviewed at length with the patient today.  The patient does not have concerns regarding medicines. ? ?The following changes have been made:  no change ? ?Labs/ tests ordered today include: None  ? ?No orders of the defined types were placed in this encounter. ? ?Lung cancer CT  ? ? ?Disposition:   FU in a year  ? ?Signed, ?Jenkins Rouge, MD  ?06/25/2021 8:50 AM    ?[facility] ?[address], [place], [place]  [address] ?Phone: [phone]; Fax: [phone]  ? ?

## 2021-06-25 ENCOUNTER — Encounter: Payer: Self-pay | Admitting: Cardiovascular Disease

## 2021-06-25 ENCOUNTER — Ambulatory Visit: Payer: PPO | Admitting: Cardiovascular Disease

## 2021-06-25 VITALS — BP 110/72 | HR 69 | Ht 62.0 in | Wt 174.0 lb

## 2021-06-25 DIAGNOSIS — I10 Essential (primary) hypertension: Secondary | ICD-10-CM

## 2021-06-25 DIAGNOSIS — F17200 Nicotine dependence, unspecified, uncomplicated: Secondary | ICD-10-CM

## 2021-06-25 DIAGNOSIS — I4821 Permanent atrial fibrillation: Secondary | ICD-10-CM | POA: Diagnosis not present

## 2021-06-25 MED ORDER — VERAPAMIL HCL ER 240 MG PO TBCR: 240.0000 mg | EXTENDED_RELEASE_TABLET | Freq: Every day | ORAL | 11 refills | Status: DC

## 2021-06-25 MED ORDER — METOPROLOL SUCCINATE ER 50 MG PO TB24: 50.0000 mg | ORAL_TABLET | Freq: Every day | ORAL | 11 refills | Status: DC

## 2021-06-25 NOTE — Patient Instructions (Addendum)
Medication Instructions:  ?Your physician recommends that you continue on your current medications as directed. Please refer to the Current Medication list given to you today. ? ?*If you need a refill on your cardiac medications before your next appointment, please call your pharmacy* ? ?Lab Work: ?If you have labs (blood work) drawn today and your tests are completely normal, you will receive your results only by: ?MyChart Message (if you have MyChart) OR ?A paper copy in the mail ?If you have any lab test that is abnormal or we need to change your treatment, we will call you to review the results. ? ?Testing/Procedures: ?Non-Cardiac CT scanning for lung cancer screening, (CAT scanning), is a noninvasive, special x-ray that produces cross-sectional images of the body using x-rays and a computer. CT scans help physicians diagnose and treat medical conditions. For some CT exams, a contrast material is used to enhance visibility in the area of the body being studied. CT scans provide greater clarity and reveal more details than regular x-ray exams. ? ? ?Follow-Up: ?At [facility], you and your health needs are our priority.  As part of our continuing mission to provide you with exceptional heart care, we have created designated Provider Care Teams.  These Care Teams include your primary Cardiologist (physician) and Advanced Practice Providers (APPs -  Physician Assistants and Nurse Practitioners) who all work together to provide you with the care you need, when you need it. ? ?We recommend signing up for the patient portal called "MyChart".  Sign up information is provided on this After Visit Summary.  MyChart is used to connect with patients for Virtual Visits (Telemedicine).  Patients are able to view lab/test results, encounter notes, upcoming appointments, etc.  Non-urgent messages can be sent to your provider as well.   ?To learn more about what you can do with MyChart, go to [email or web address].   ? ?Your  next appointment:   ?12 month(s) ? ?The format for your next appointment:   ?In Person ? ?Provider:   ?Jenkins Rouge, MD  ? ?Important Information About Sugar ? ? ? ? ?  ?

## 2021-07-09 ENCOUNTER — Other Ambulatory Visit: Payer: Self-pay | Admitting: Cardiovascular Disease

## 2021-07-09 NOTE — Telephone Encounter (Signed)
Prescription refill request for Eliquis received. ?Indication: Atrial Fib ?Last office visit: 06/25/21  Edmonia James MD ?Scr: 1.0 on 03/22/20 ?Age: 71 ?Weight: 78.9kg ? ?Based on above findings Eliquis '5mg'$  twice daily is the appropriate dose.  Refill approved. ? ?

## 2021-07-24 ENCOUNTER — Ambulatory Visit (INDEPENDENT_AMBULATORY_CARE_PROVIDER_SITE_OTHER)
Admission: RE | Admit: 2021-07-24 | Discharge: 2021-07-24 | Disposition: A | Discharge status: Home / Self Care | Payer: PPO | Source: Ambulatory Visit | Attending: Cardiovascular Disease | Admitting: Cardiovascular Disease

## 2021-07-24 DIAGNOSIS — Z87891 Personal history of nicotine dependence: Secondary | ICD-10-CM

## 2021-07-24 DIAGNOSIS — F17210 Nicotine dependence, cigarettes, uncomplicated: Secondary | ICD-10-CM | POA: Diagnosis not present

## 2021-07-24 DIAGNOSIS — I10 Essential (primary) hypertension: Secondary | ICD-10-CM

## 2021-07-24 DIAGNOSIS — F17200 Nicotine dependence, unspecified, uncomplicated: Secondary | ICD-10-CM

## 2021-07-24 DIAGNOSIS — I4821 Permanent atrial fibrillation: Secondary | ICD-10-CM

## 2021-08-24 DIAGNOSIS — I482 Chronic atrial fibrillation, unspecified: Secondary | ICD-10-CM | POA: Diagnosis not present

## 2021-08-24 DIAGNOSIS — I10 Essential (primary) hypertension: Secondary | ICD-10-CM | POA: Diagnosis not present

## 2021-08-24 DIAGNOSIS — F329 Major depressive disorder, single episode, unspecified: Secondary | ICD-10-CM | POA: Diagnosis not present

## 2021-08-24 DIAGNOSIS — E785 Hyperlipidemia, unspecified: Secondary | ICD-10-CM | POA: Diagnosis not present

## 2021-10-04 DIAGNOSIS — H02839 Dermatochalasis of unspecified eye, unspecified eyelid: Secondary | ICD-10-CM | POA: Diagnosis not present

## 2021-10-04 DIAGNOSIS — H524 Presbyopia: Secondary | ICD-10-CM | POA: Diagnosis not present

## 2021-10-04 DIAGNOSIS — H2513 Age-related nuclear cataract, bilateral: Secondary | ICD-10-CM | POA: Diagnosis not present

## 2021-10-04 DIAGNOSIS — Z135 Encounter for screening for eye and ear disorders: Secondary | ICD-10-CM | POA: Diagnosis not present

## 2021-10-04 DIAGNOSIS — H52223 Regular astigmatism, bilateral: Secondary | ICD-10-CM | POA: Diagnosis not present

## 2021-10-04 DIAGNOSIS — H5213 Myopia, bilateral: Secondary | ICD-10-CM | POA: Diagnosis not present

## 2021-10-16 DIAGNOSIS — Z01818 Encounter for other preprocedural examination: Secondary | ICD-10-CM | POA: Diagnosis not present

## 2021-10-16 DIAGNOSIS — H25812 Combined forms of age-related cataract, left eye: Secondary | ICD-10-CM | POA: Diagnosis not present

## 2021-10-16 DIAGNOSIS — H25811 Combined forms of age-related cataract, right eye: Secondary | ICD-10-CM | POA: Diagnosis not present

## 2021-10-22 DIAGNOSIS — E669 Obesity, unspecified: Secondary | ICD-10-CM | POA: Diagnosis not present

## 2021-10-22 DIAGNOSIS — J449 Chronic obstructive pulmonary disease, unspecified: Secondary | ICD-10-CM | POA: Diagnosis not present

## 2021-10-22 DIAGNOSIS — Z23 Encounter for immunization: Secondary | ICD-10-CM | POA: Diagnosis not present

## 2021-10-22 DIAGNOSIS — I10 Essential (primary) hypertension: Secondary | ICD-10-CM | POA: Diagnosis not present

## 2021-10-22 DIAGNOSIS — Z720 Tobacco use: Secondary | ICD-10-CM | POA: Diagnosis not present

## 2021-10-22 DIAGNOSIS — Z1331 Encounter for screening for depression: Secondary | ICD-10-CM | POA: Diagnosis not present

## 2021-11-01 DIAGNOSIS — H25811 Combined forms of age-related cataract, right eye: Secondary | ICD-10-CM | POA: Diagnosis not present

## 2021-11-01 DIAGNOSIS — H269 Unspecified cataract: Secondary | ICD-10-CM | POA: Diagnosis not present

## 2021-11-22 DIAGNOSIS — H269 Unspecified cataract: Secondary | ICD-10-CM | POA: Diagnosis not present

## 2021-11-22 DIAGNOSIS — H25812 Combined forms of age-related cataract, left eye: Secondary | ICD-10-CM | POA: Diagnosis not present

## 2022-03-22 ENCOUNTER — Other Ambulatory Visit: Payer: Self-pay | Admitting: Cardiovascular Disease

## 2022-03-22 NOTE — Telephone Encounter (Signed)
Prescription refill request for Eliquis received. Indication:afib Last office visit:5/23 Scr:1.0 Age: 72 Weight:78.9  kg  Prescription refilled

## 2022-05-06 DIAGNOSIS — Z1331 Encounter for screening for depression: Secondary | ICD-10-CM | POA: Diagnosis not present

## 2022-05-06 DIAGNOSIS — I10 Essential (primary) hypertension: Secondary | ICD-10-CM | POA: Diagnosis not present

## 2022-05-06 DIAGNOSIS — Z7901 Long term (current) use of anticoagulants: Secondary | ICD-10-CM | POA: Diagnosis not present

## 2022-05-06 DIAGNOSIS — E785 Hyperlipidemia, unspecified: Secondary | ICD-10-CM | POA: Diagnosis not present

## 2022-05-06 DIAGNOSIS — Z23 Encounter for immunization: Secondary | ICD-10-CM | POA: Diagnosis not present

## 2022-05-06 DIAGNOSIS — I482 Chronic atrial fibrillation, unspecified: Secondary | ICD-10-CM | POA: Diagnosis not present

## 2022-05-06 DIAGNOSIS — Z0000 Encounter for general adult medical examination without abnormal findings: Secondary | ICD-10-CM | POA: Diagnosis not present

## 2022-05-06 DIAGNOSIS — R82998 Other abnormal findings in urine: Secondary | ICD-10-CM | POA: Diagnosis not present

## 2022-05-06 DIAGNOSIS — E039 Hypothyroidism, unspecified: Secondary | ICD-10-CM | POA: Diagnosis not present

## 2022-05-06 DIAGNOSIS — E669 Obesity, unspecified: Secondary | ICD-10-CM | POA: Diagnosis not present

## 2022-05-06 DIAGNOSIS — J449 Chronic obstructive pulmonary disease, unspecified: Secondary | ICD-10-CM | POA: Diagnosis not present

## 2022-05-06 DIAGNOSIS — I700 Atherosclerosis of aorta: Secondary | ICD-10-CM | POA: Diagnosis not present

## 2022-05-06 DIAGNOSIS — Z720 Tobacco use: Secondary | ICD-10-CM | POA: Diagnosis not present

## 2022-05-06 DIAGNOSIS — Z1339 Encounter for screening examination for other mental health and behavioral disorders: Secondary | ICD-10-CM | POA: Diagnosis not present

## 2022-05-06 LAB — LAB REPORT - SCANNED: EGFR (Non-African Amer.): 83.7

## 2022-05-07 DIAGNOSIS — D485 Neoplasm of uncertain behavior of skin: Secondary | ICD-10-CM | POA: Diagnosis not present

## 2022-05-07 DIAGNOSIS — H02834 Dermatochalasis of left upper eyelid: Secondary | ICD-10-CM | POA: Diagnosis not present

## 2022-05-07 DIAGNOSIS — H02831 Dermatochalasis of right upper eyelid: Secondary | ICD-10-CM | POA: Diagnosis not present

## 2022-06-11 ENCOUNTER — Other Ambulatory Visit: Payer: Self-pay | Admitting: Cardiovascular Disease

## 2022-06-12 ENCOUNTER — Telehealth: Payer: Self-pay | Admitting: Cardiovascular Disease

## 2022-06-12 MED ORDER — METOPROLOL SUCCINATE ER 50 MG PO TB24: 50.0000 mg | ORAL_TABLET | Freq: Every day | ORAL | 1 refills | Status: DC

## 2022-06-12 NOTE — Telephone Encounter (Signed)
Pt's medication was sent to pt's pharmacy as requested. Confirmation received.  °

## 2022-06-12 NOTE — Telephone Encounter (Signed)
*  STAT* If patient is at the pharmacy, call can be transferred to refill team.   1. Which medications need to be refilled? (please list name of each medication and dose if known) metoprolol succinate (TOPROL-XL) 50 MG 24 hr tablet   2. Which pharmacy/location (including street and city if local pharmacy) is medication to be sent to?  [id number] - [place], [place] - [address] AT [address]    3. Do they need a 30 day or 90 day supply? 30 day    Pt has scheduled office visit appt on 10/02/2022.

## 2022-06-20 ENCOUNTER — Telehealth: Payer: Self-pay | Admitting: *Deleted

## 2022-06-20 NOTE — Telephone Encounter (Signed)
   Pre-operative Risk Assessment    Patient Name: Linda Ruiz  DOB: 30-Dec-1950 MRN: [id number]      Request for Surgical Clearance    Procedure:   BLEPHAROPLASTY, BOTH UPPER AND LIDS-FUNCTIONAL-ESC (OR)  Date of Surgery:  Clearance 07/11/22                                 Surgeon:  DR. Dairl Ponder Surgeon's Group or Practice Name:  [facility]  Phone number:  [phone] Fax number:  [phone]   Type of Clearance Requested:   - Medical  - Pharmacy:  Hold Apixaban (Eliquis) x 3 DAYS PRIOR AND 3 DAYS POST OP   Type of Anesthesia:   NEED TO CONFIRM ANESTHESIA, IT IS NOT ON THE CLEARANCE REQUEST    Additional requests/questions:    Elpidio Anis   06/20/2022, 5:19 PM

## 2022-06-21 NOTE — Telephone Encounter (Signed)
S/w pt Linda Ruiz at [facility], the anesthesia will be MAC.

## 2022-07-05 ENCOUNTER — Telehealth: Payer: Self-pay | Admitting: Cardiovascular Disease

## 2022-07-05 ENCOUNTER — Other Ambulatory Visit: Payer: Self-pay | Admitting: Cardiovascular Disease

## 2022-07-05 NOTE — Telephone Encounter (Signed)
Patient with diagnosis of afib on Eliquis for anticoagulation.    Procedure: BLEPHAROPLASTY, BOTH UPPER AND LIDS-FUNCTIONAL-ESC (OR)  Date of procedure: 07/11/22   Patient chart said she has a hx of CVA. I called patient to confirm this as there is no other documentation of this, nor can I find any imaging studies. Patient denies ever having a stroke. I removed this from problem list.  CHA2DS2-VASc Score = 3   This indicates a 3.2% annual risk of stroke. The patient's score is based upon: CHF History: 0 HTN History: 1 Diabetes History: 0 Stroke History: 0 Vascular Disease History: 0 Age Score: 1 Gender Score: 1      I cannot seem to find any recent BMP for patient. Will need this updated before we can give final clearance. Can we also find out if MD is asking for 3 days before and after or just 3 days total (2 before, 1 after) it is different in each of the clearances sent.  **This guidance is not considered finalized until pre-operative APP has relayed final recommendations.**

## 2022-07-05 NOTE — Telephone Encounter (Signed)
I will forward to pre op APP.     Jul 05, 2022    SW   07/05/22 12:00 PM Laurence Ferrari routed this conversation to Cv Div Preop Callback Laurence Ferrari   SW   07/05/22 12:00 PM Note Follow Up:     [name] said they have not received the clearance back from 06-21-22 or the one they re faxed on 07-03-22. There is also a change in the hold on patient's Eliquis.. They are not on Epic

## 2022-07-05 NOTE — Telephone Encounter (Signed)
Pt has been scheduled for in office appt for pre op clearance  07/08/22 with Robin Searing, NP. I did ask pt if she has any recent lab work done with PCP, pt answered yes. I called the PCP office and left message for their lab that we are looking for BMP/CBC. Pt states she had lab work done recently that she thought was around March 2024. I left message if they could fax labs to our office [phone] attn: pre op team. If any questions please call [phone]. I do have the pt scheduled with Robin Searing, NP 07/08/22 @ 8 am. I am hoping that we will have labs before the appt, if not the pt will need labs to be done at the appt.  I will update all parties involved.

## 2022-07-05 NOTE — Telephone Encounter (Signed)
Follow Up:   [name] said they have not received the clearance back from 06-21-22 or the one they re faxed on 07-03-22. There is also a change in the hold on patient's Eliquis.. They are not on Epic  .      Pre-operative Risk Assessment    Patient Name: Linda Ruiz  DOB: January 29, 1951 MRN: [id number]      Request for Surgical Clearance    Procedure:  Blepharoplasty  Date of Surgery:  Clearance      [id number]                             Surgeon:  Dr Dairl Ponder Surgeon's Group or Practice Name:   Phone number:  [phone] Fax number:  [phone]   Type of Clearance Requested: Medicine-  would like to hold Eliquis 2 days prior to surgery and third day after surgery    Type of Anesthesia:  MAC   Additional requests/questions:    Signed, Laurence Ferrari   07/05/2022, 11:55 AM

## 2022-07-07 NOTE — Progress Notes (Unsigned)
Office Visit    Patient Name: Linda Ruiz Date of Encounter: 07/07/2022  Primary Care Provider:  Geoffry Paradise, MD Primary Cardiologist:  Linda Haws, MD Primary Electrophysiologist: None   Past Medical History    Past Medical History:  Diagnosis Date   Anxiety    Atrial fibrillation [facility])    Cataract    bilateral,starting   Colon polyps    Depression    Diverticulosis    Fracture of wrist, closed 08/2012   Left   Hyperlipidemia    Hypertension    Migraine    Skin cancer    Sleep apnea    uses cpap   Vaginal intraepithelial neoplasia grade 2 4/13   vin 3 with biopsy   Wears dentures    top   Wears glasses    Past Surgical History:  Procedure Laterality Date   BUNIONECTOMY  05/2010   Left   CARPAL TUNNEL RELEASE Left 01/19/2013   Procedure: LEFT CARPAL TUNNEL RELEASE;  Surgeon: Tami Ribas, MD;  Location: [facility];  Service: Orthopedics;  Laterality: Left;   CESAREAN SECTION     COLONOSCOPY     DILATION AND CURETTAGE OF UTERUS  2005   endometrial polyp removed    OPEN REDUCTION INTERNAL FIXATION (ORIF) DISTAL RADIAL FRACTURE Left 09/08/2012   Procedure: OPEN REDUCTION INTERNAL FIXATION (ORIF) DISTAL RADIAL FRACTURE;  Surgeon: Tami Ribas, MD;  Location: [facility];  Service: Orthopedics;  Laterality: Left;   POLYPECTOMY     SKIN CANCER EXCISION     TUBAL LIGATION      Allergies  Allergies  Allergen Reactions   Sulfa Antibiotics Other (See Comments)    Unknown to sx, had allergy as a child   Sulfasalazine Other (See Comments)    Unknown to sx, had allergy as a child Unknown to sx, had allergy as a child   Sulfonamide Derivatives Rash     History of Present Illness    Linda Ruiz  is a 72 year old female with a PMH of chronic A-fib (on Eliquis), HTN, HLD, COPD, aortic atherosclerosis, tobacco abuse who presents today for preoperative clearance.  This [place] was initially seen by Dr. Eden Emms since  2010 for management of atrial fibrillation.  She was initially on Coumadin and then transition to Eliquis 06/2018.  Completed a Lexiscan Myoview in 2018 that was low risk with no evidence of ischemia.  2D echo completed in 2018 also showed EF of 60 to 65% with no RWMA and mildly dilated LA, no aortic stenosis or regurgitation present.  She was last seen by Dr. Eden Emms on 06/2021 for follow-up.  She had lost reportedly 25 pounds and was feeling better.  She had no cardiac complaints at that time.  She completed a noncontrast CT of the chest due to smoking history that showed suggestion of pulmonary hypertension, aortic sclerosis, with emphysema.  [name] presents today for preoperative clearance visit and annual follow-up alone.  Since last being seen in the office patient reports she has been doing well with no new cardiac complaints.  She is scheduled to have blepharoplasty procedure coming up in a few days.  Her blood pressure was initially elevated at 142/98 and on recheck it was 148/80 on recheck.  She reports not taking her blood pressure medicine this morning and states compliance with her current regimen.  She was recently seen by her PCP and had lab work completed with no abnormalities noted.  During  today's visit we discussed the importance of primary and secondary prevention and the need to increase physical activity.  She reports staying active with housework but is not currently on any scheduled exercise routine.  Patient denies chest pain, palpitations, dyspnea, PND, orthopnea, nausea, vomiting, dizziness, syncope, edema, weight gain, or early satiety.  Home Medications    Current Outpatient Medications  Medication Sig Dispense Refill   atorvastatin (LIPITOR) 20 MG tablet Take 20 mg by mouth daily.      ELIQUIS 5 MG TABS tablet TAKE 1 TABLET(5 MG) BY MOUTH TWICE DAILY 180 tablet 1   levothyroxine (SYNTHROID) 50 MCG tablet Take 50 mcg by mouth every morning.     metoprolol succinate  (TOPROL-XL) 50 MG 24 hr tablet Take 1 tablet (50 mg total) by mouth daily. 90 tablet 1   olmesartan (BENICAR) 40 MG tablet Take 1 tablet by mouth daily.     sertraline (ZOLOFT) 50 MG tablet Take 50 mg by mouth at bedtime.     verapamil (CALAN-SR) 240 MG CR tablet Take 1 tablet (240 mg total) by mouth daily. 30 tablet 11   No current facility-administered medications for this visit.     Review of Systems  Please see the history of present illness.    (+) Fatigue (+) Shortness of breath with heavy exertion  All other systems reviewed and are otherwise negative except as noted above.  Physical Exam    Wt Readings from Last 3 Encounters:  06/25/21 174 lb (78.9 kg)  04/12/20 187 lb (84.8 kg)  03/16/19 207 lb 6.4 oz (94.1 kg)   NW:GNFAO were no vitals filed for this visit.,There is no height or weight on file to calculate BMI.  Constitutional:      Appearance: Healthy appearance. Not in distress.  Neck:     Vascular: JVD normal.  Pulmonary:     Effort: Pulmonary effort is normal.     Breath sounds: Left upper and lower lobe wheezing. No rales. Diminished in the bases Cardiovascular:  Irregularly irregular normal S1. Normal S2.      Murmurs: There is no murmur.  Edema:    Peripheral edema absent.  Abdominal:     Palpations: Abdomen is soft non tender. There is no hepatomegaly.  Skin:    General: Skin is warm and dry.  Neurological:     General: No focal deficit present.     Mental Status: Alert and oriented to person, place and time.     Cranial Nerves: Cranial nerves are intact.  EKG/LABS/ Recent Cardiac Studies    ECG personally reviewed by me today -atrial fibrillation with rate of 73 bpm and no acute changes with nonspecific T wave abnormalities  Cardiac Studies & Procedures     STRESS TESTS  MYOCARDIAL PERFUSION IMAGING 10/15/2016  Narrative  Nuclear stress EF: 73%.  The left ventricular ejection fraction is hyperdynamic (>65%).  There was no ST segment  deviation noted during stress.  This is a low risk study.  1. EF 73%, normal wall motion. 2. Fixed small, mild basal anterolateral perfusion defect.  Given normal wall motion, this may represent attenuation.  No ischemia.  Low risk study.   ECHOCARDIOGRAM  ECHOCARDIOGRAM COMPLETE 10/15/2016  Narrative *[facility]* [address] [place], [place] [address] [phone]  ------------------------------------------------------------------- Transthoracic Echocardiography  Patient:    Zaiyah, Tank MR #:       [id number] Study Date: 10/15/2016 Gender:     F Age:  72 Height:     157.5 cm Weight:     93.9 kg BSA:        2.07 m^2 Pt. Status: Room:  ATTENDING    Kristeen Miss, M.D. Julianne Rice, Elvina Sidle REFERRING    [place]man Nip PERFORMING   [facility], Outpatient SONOGRAPHER  [facility], RDCS  cc:  ------------------------------------------------------------------- LV EF: 60% -   65%  ------------------------------------------------------------------- Indications:      Atrial Fibrillation (I48.91).  ------------------------------------------------------------------- History:   PMH:  Edema, Obstructive Sleep Apnea.  Dyspnea.  Atrial fibrillation.  Risk factors:  Current tobacco use. Hypertension. Dyslipidemia.  ------------------------------------------------------------------- Study Conclusions  - Left ventricle: The cavity size was normal. Wall thickness was normal. Systolic function was normal. The estimated ejection fraction was in the range of 60% to 65%. Wall motion was normal; there were no regional wall motion abnormalities. - Left atrium: The atrium was mildly dilated.  ------------------------------------------------------------------- Study data:  Comparison was made to the study of 12/06/2013.  Study status:  Routine.  Procedure:  Transthoracic echocardiography. Image quality was adequate. The study was technically  difficult, as a result of poor acoustic windows. Intravenous contrast (Definity) was administered.          Transthoracic echocardiography.  M-mode, complete 2D, spectral Doppler, and color Doppler.  Birthdate: Patient birthdate: June 19, 1950.  Age:  Patient is 72 yr old.  Sex: Gender: female.    BMI: 37.9 kg/m^2.  Blood pressure:     110/67 Patient status:  Outpatient.  Study date:  Study date: 10/15/2016. Study time: 10:29 AM.  Location:  [facility]  -------------------------------------------------------------------  ------------------------------------------------------------------- Left ventricle:  The cavity size was normal. Wall thickness was normal. Systolic function was normal. The estimated ejection fraction was in the range of 60% to 65%. Wall motion was normal; there were no regional wall motion abnormalities. The study was not technically sufficient to allow evaluation of LV diastolic dysfunction due to atrial fibrillation.  ------------------------------------------------------------------- Aortic valve:   Structurally normal valve.   Cusp separation was normal.  Doppler:  Transvalvular velocity was within the normal range. There was no stenosis. There was no regurgitation.  ------------------------------------------------------------------- Aorta:  Aortic root: The aortic root was normal in size. Ascending aorta: The ascending aorta was normal in size.  ------------------------------------------------------------------- Mitral valve:   Structurally normal valve.   Leaflet separation was normal.  Doppler:  Transvalvular velocity was within the normal range. There was no evidence for stenosis. There was no regurgitation.  ------------------------------------------------------------------- Left atrium:  The atrium was mildly dilated.  ------------------------------------------------------------------- Right ventricle:  The cavity size was normal. Systolic  function was normal.  ------------------------------------------------------------------- Pulmonic valve:    The valve appears to be grossly normal. Doppler:  There was no significant regurgitation.  ------------------------------------------------------------------- Tricuspid valve:   The valve appears to be grossly normal. Doppler:  There was mild regurgitation.  ------------------------------------------------------------------- Right atrium:  The atrium was normal in size.  ------------------------------------------------------------------- Pericardium:  There was no pericardial effusion.  ------------------------------------------------------------------- Systemic veins: Inferior vena cava: The vessel was normal in size. The respirophasic diameter changes were in the normal range (>= 50%), consistent with normal central venous pressure.  ------------------------------------------------------------------- Measurements  Left ventricle                         Value        Reference LV ID, ED, PLAX chordal        (L)     41.3  mm     43 - 52 LV ID, ES, PLAX chordal                28    mm     23 - 38 LV fx shortening, PLAX chordal         32    %      >=29 LV PW thickness, ED                    10.2  mm     ---------- IVS/LV PW ratio, ED                    0.98         <=1.3 Stroke volume, 2D                      51    ml     ---------- Stroke volume/bsa, 2D                  25    ml/m^2 ---------- LV e&', lateral                         10    cm/s   ---------- LV e&', medial                          7.62  cm/s   ---------- LV e&', average                         8.81  cm/s   ----------  Ventricular septum                     Value        Reference IVS thickness, ED                      9.96  mm     ----------  LVOT                                   Value        Reference LVOT ID, S                             20    mm     ---------- LVOT area                               3.14  cm^2   ---------- LVOT peak velocity, S                  84.8  cm/s   ---------- LVOT mean velocity, S                  53    cm/s   ---------- LVOT VTI, S                            16.2  cm     ----------  Aorta  Value        Reference Aortic root ID, ED                     31    mm     ---------- Ascending aorta ID, A-P, S             38    mm     ----------  Left atrium                            Value        Reference LA ID, A-P, ES                         45    mm     ---------- LA ID/bsa, A-P                         2.17  cm/m^2 <=2.2 LA volume, S                           75.9  ml     ---------- LA volume/bsa, S                       36.6  ml/m^2 ---------- LA volume, ES, 1-p A4C                 77.1  ml     ---------- LA volume/bsa, ES, 1-p A4C             37.2  ml/m^2 ---------- LA volume, ES, 1-p A2C                 62.4  ml     ---------- LA volume/bsa, ES, 1-p A2C             30.1  ml/m^2 ----------  Tricuspid valve                        Value        Reference Tricuspid regurg peak velocity         233   cm/s   ---------- Tricuspid peak RV-RA gradient          22    mm Hg  ----------  Right atrium                           Value        Reference RA ID, S-I, ES, A4C            (H)     52.1  mm     34 - 49 RA area, ES, A4C                       17.6  cm^2   8.3 - 19.5 RA volume, ES, A/L                     48.5  ml     ---------- RA volume/bsa, ES, A/L                 23.4  ml/m^2 ----------  Right ventricle  Value        Reference TAPSE                                  17.9  mm     ---------- RV s&', lateral, S                      11.8  cm/s   ----------  Legend: (L)  and  (H)  mark values outside specified reference range.  ------------------------------------------------------------------- Prepared and Electronically Authenticated by  Kristeen Miss, M.D. 2018-08-21T13:33:40             Risk  Assessment/Calculations:    CHA2DS2-VASc Score = 3   This indicates a 3.2% annual risk of stroke. The patient's score is based upon: CHF History: 0 HTN History: 1 Diabetes History: 0 Stroke History: 0 Vascular Disease History: 0 Age Score: 1 Gender Score: 1           Lab Results  Component Value Date   WBC 7.4 11/26/2016   HGB 15.3 (H) 11/26/2016   HCT 46.1 (H) 11/26/2016   MCV 97.4 11/26/2016   PLT 210.0 11/26/2016   Lab Results  Component Value Date   CREATININE 1.06 05/30/2014   BUN 16 05/30/2014   NA 137 05/30/2014   K 4.0 05/30/2014   CL 105 05/30/2014   CO2 27 05/30/2014   Lab Results  Component Value Date   ALT 17 04/06/2008   AST 20 04/06/2008   ALKPHOS 52 04/06/2008   BILITOT 0.8 04/06/2008   Lab Results  Component Value Date   CHOL 192 04/06/2008   HDL 37.1 (L) 04/06/2008   LDLCALC 125 (H) 04/06/2008   TRIG 151 (H) 04/06/2008   CHOLHDL 5.2 CALC 04/06/2008    No results found for: "HGBA1C"   Assessment & Plan    1.  Preoperative clearance: -Patient's RCRI score is 0.4%  -The patient affirms she has been doing well without any new cardiac symptoms. They are able to achieve 6 METS without cardiac limitations. Therefore, based on ACC/AHA guidelines, the patient would be at acceptable risk for the planned procedure without further cardiovascular testing. The patient was advised that if she develops new symptoms prior to surgery to contact our office to arrange for a follow-up visit, and she verbalized understanding.   Ok to hold Eliquis up to 2 days prior to procedure. Typically resume 24 hours after low bleed risk procedure and 48 hours after higher bleed risk procedure per ACC/AHA recs. CHADS2VASC score is lower so resuming up to 48 hrs after procedure would be ok.   2.  Permanent A-fib: -Patient is in rate controlled atrial fibrillation with no acute changes from previous visit. -Patient currently on verapamil 240 mg for rate control. -We will  complete updated BMET and CBC -Patient advised to continue Eliquis 5 mg twice daily -Patient reports no recurrence of tachycardia or accelerated heart rate.  3.  Essential hypertension: -Patient's blood pressure today was initially 142/98 and was 148/80 on recheck.  She reports not taking her blood pressure medications and anxiety related to upcoming surgical procedure. -Continue olmesartan 40 mg daily, verapamil 240 mg daily and Toprol 50 mg daily  4.  Hyperlipidemia: -Patient's last LDL cholesterol was 115 and is currently followed by PCP -Continue Lipitor 20 mg daily  5.  COPD: -Patient diagnosed with emphysema in 2023 -Today on auscultation patient had wheezing in upper  and lower lobes of right lung. -She is interested in seeing pulmonology and wants to discuss further with her PCP.    Disposition: Follow-up with Linda Haws, MD or APP in 12 months    Medication Adjustments/Labs and Tests Ordered: Current medicines are reviewed at length with the patient today.  Concerns regarding medicines are outlined above.   Signed, Napoleon Form, Leodis Rains, NP 07/07/2022, 2:17 PM [facility]

## 2022-07-08 ENCOUNTER — Encounter: Payer: Self-pay | Admitting: Nurse Practitioner

## 2022-07-08 ENCOUNTER — Ambulatory Visit: Payer: PPO | Attending: Nurse Practitioner | Admitting: Nurse Practitioner

## 2022-07-08 VITALS — BP 148/80 | HR 73 | Ht 62.0 in | Wt 186.0 lb

## 2022-07-08 DIAGNOSIS — I10 Essential (primary) hypertension: Secondary | ICD-10-CM

## 2022-07-08 DIAGNOSIS — Z01810 Encounter for preprocedural cardiovascular examination: Secondary | ICD-10-CM

## 2022-07-08 DIAGNOSIS — I4821 Permanent atrial fibrillation: Secondary | ICD-10-CM

## 2022-07-08 DIAGNOSIS — E782 Mixed hyperlipidemia: Secondary | ICD-10-CM

## 2022-07-08 DIAGNOSIS — J449 Chronic obstructive pulmonary disease, unspecified: Secondary | ICD-10-CM

## 2022-07-08 LAB — BASIC METABOLIC PANEL
BUN/Creatinine Ratio: 11 — ABNORMAL LOW (ref 12–28)
BUN: 12 mg/dL (ref 8–27)
CO2: 29 mmol/L (ref 20–29)
Calcium: 9.8 mg/dL (ref 8.7–10.3)
Chloride: 104 mmol/L (ref 96–106)
Creatinine, Ser: 1.12 mg/dL — ABNORMAL HIGH (ref 0.57–1.00)
Glucose: 99 mg/dL (ref 70–99)
Potassium: 4.5 mmol/L (ref 3.5–5.2)
Sodium: 141 mmol/L (ref 134–144)
eGFR: 53 mL/min/{1.73_m2} — ABNORMAL LOW (ref >59)

## 2022-07-08 LAB — CBC
Hematocrit: 46 % (ref 34.0–46.6)
Hemoglobin: 15.5 g/dL (ref 11.1–15.9)
MCH: 32.1 pg (ref 26.6–33.0)
MCHC: 33.7 g/dL (ref 31.5–35.7)
MCV: 95 fL (ref 79–97)
Platelets: 194 10*3/uL (ref 150–450)
RBC: 4.83 x10E6/uL (ref 3.77–5.28)
RDW: 13.6 % (ref 11.7–15.4)
WBC: 6.7 10*3/uL (ref 3.4–10.8)

## 2022-07-08 NOTE — Telephone Encounter (Signed)
Office calling for update. Please advise  

## 2022-07-08 NOTE — Telephone Encounter (Signed)
   Patient Name: Linda Ruiz  DOB: [birth year] MRN: [id number]  Primary Cardiologist: Charlton Haws, MD  Chart reviewed as part of pre-operative protocol coverage. Pre-op clearance already addressed by colleagues in earlier phone notes. To summarize recommendations:  2 days of an Eliquis hold should be sufficient and 3 days max.   -Dr. Eden Emms  Will route this bundled recommendation to requesting provider via Epic fax function and remove from pre-op pool. Please call with questions.  Sharlene Dory, PA-C 07/08/2022, 3:47 PM

## 2022-07-08 NOTE — Telephone Encounter (Signed)
   Patient Name: Linda Ruiz  DOB: Aug 31, 1950 MRN: [id number]  Primary Cardiologist: Charlton Haws, MD  Chart reviewed as part of pre-operative protocol coverage. Given past medical history and time since last visit, based on ACC/AHA guidelines, Linda Ruiz is at acceptable risk for the planned procedure without further cardiovascular testing.  She was able to complete greater than 4 METS of activity and patient is RCRI score was 0.4%.  The patient was advised that if she develops new symptoms prior to surgery to contact our office to arrange for a follow-up visit, and she verbalized understanding.  Ok to hold Eliquis up to 2 days prior to procedure. Typically resume 24 hours after low bleed risk procedure and 48 hours after higher bleed risk procedure per ACC/AHA recs. CHADS2VASC score is lower so resuming up to 48 hrs after procedure would be ok.   I will route this recommendation to the requesting party via Epic fax function and remove from pre-op pool.  Please call with questions.  Napoleon Form, Leodis Rains, NP 07/08/2022, 12:42 PM

## 2022-07-08 NOTE — Telephone Encounter (Signed)
I will forward this to Robin Searing, NP who saw the pt today.

## 2022-07-08 NOTE — Telephone Encounter (Signed)
Ms. Linda Ruiz was seen in clinic today for preop clearance.  She was pending CBC and BMET for clearance on holding her Eliquis for 2 days before blepharoplasty procedure.  I obtained stat CBC and BMET which are available for review in our system.  Please advise on if patient is cleared to hold medication for procedure scheduled for 07/11/2022.  Thank you  Robin Searing, NP

## 2022-07-08 NOTE — Telephone Encounter (Signed)
Linda Ruiz is calling again to get days for hold changed to 3 days before and 3 days after due to the surgeons preference.

## 2022-07-08 NOTE — Telephone Encounter (Signed)
Labs reviewed, CBC stable, plt count 194K. CrCl 75mL/min using adjusted body weight. There are 2 different phone notes about this clearance with different anticoag hold requests. This is a lower bleed risk procedure. Ok to hold Eliquis up to 2 days prior to procedure. Typically resume 24 hours after low bleed risk procedure and 48 hours after higher bleed risk procedure per ACC/AHA recs. CHADS2VASC score is lower so resuming up to 48 hrs after procedure would be ok.

## 2022-07-08 NOTE — Patient Instructions (Signed)
Medication Instructions:  Your physician recommends that you continue on your current medications as directed. Please refer to the Current Medication list given to you today. *If you need a refill on your cardiac medications before your next appointment, please call your pharmacy*   Lab Work: TODAY-STAT CBC & BMET If you have labs (blood work) drawn today and your tests are completely normal, you will receive your results only by: MyChart Message (if you have MyChart) OR A paper copy in the mail If you have any lab test that is abnormal or we need to change your treatment, we will call you to review the results.   Testing/Procedures: NONE ORDERED   Follow-Up: At [facility], you and your health needs are our priority.  As part of our continuing mission to provide you with exceptional heart care, we have created designated Provider Care Teams.  These Care Teams include your primary Cardiologist (physician) and Advanced Practice Providers (APPs -  Physician Assistants and Nurse Practitioners) who all work together to provide you with the care you need, when you need it.  We recommend signing up for the patient portal called "MyChart".  Sign up information is provided on this After Visit Summary.  MyChart is used to connect with patients for Virtual Visits (Telemedicine).  Patients are able to view lab/test results, encounter notes, upcoming appointments, etc.  Non-urgent messages can be sent to your provider as well.   To learn more about what you can do with MyChart, go to [email or web address].    Your next appointment:   12 month(s)  Provider:   Charlton Haws, MD     Other Instructions Check your blood pressure daily for 1 week, then contact the office with your readings.

## 2022-07-08 NOTE — Telephone Encounter (Signed)
This is a very long hold time for an eyelid procedure. Would defer to cardiologist.

## 2022-07-11 DIAGNOSIS — H02831 Dermatochalasis of right upper eyelid: Secondary | ICD-10-CM | POA: Diagnosis not present

## 2022-07-11 DIAGNOSIS — H02834 Dermatochalasis of left upper eyelid: Secondary | ICD-10-CM | POA: Diagnosis not present

## 2022-07-23 ENCOUNTER — Other Ambulatory Visit: Payer: Self-pay | Admitting: Cardiovascular Disease

## 2022-08-03 ENCOUNTER — Other Ambulatory Visit: Payer: Self-pay | Admitting: Cardiovascular Disease

## 2022-08-05 NOTE — Telephone Encounter (Signed)
Prescription refill request for Eliquis received. Indication:afib Last office visit:5/24 Scr:1.12  5/24 Age: 72 Weight:84.4  kg  Prescription refilled

## 2022-10-02 ENCOUNTER — Ambulatory Visit: Payer: PPO | Admitting: Cardiovascular Disease

## 2022-10-05 ENCOUNTER — Ambulatory Visit
Admission: EM | Admit: 2022-10-05 | Discharge: 2022-10-05 | Disposition: A | Discharge status: Home / Self Care | Payer: PPO | Attending: Internal Medicine | Admitting: Internal Medicine

## 2022-10-05 DIAGNOSIS — H18892 Other specified disorders of cornea, left eye: Secondary | ICD-10-CM

## 2022-10-05 MED ORDER — ERYTHROMYCIN 5 MG/GM OP OINT: TOPICAL_OINTMENT | OPHTHALMIC | 0 refills | Status: DC

## 2022-10-05 NOTE — Discharge Instructions (Signed)
I have prescribed an antibiotic ointment to apply to the eye.  Follow-up with your eye doctor Monday to schedule appointment for further evaluation.

## 2022-10-05 NOTE — ED Provider Notes (Signed)
[facility]    CSN: [id number] Arrival date & time: 10/05/22  1154      History   Chief Complaint Chief Complaint  Patient presents with   Eye Pain    HPI Linda Ruiz is a 72 y.o. female.   Patient presents with left eye irritation and watery drainage that started this morning upon awakening.  Patient denies trauma or foreign body to the eye.  She reports that she wears readers but does not wear any additional contacts or glasses.  Reports very mild blurry vision due to film over her eye.  Denies any known sick contacts or fever.   Eye Pain    Past Medical History:  Diagnosis Date   Anxiety    Atrial fibrillation (HCC)    Cataract    bilateral,starting   Colon polyps    Depression    Diverticulosis    Fracture of wrist, closed 08/2012   Left   Hyperlipidemia    Hypertension    Migraine    Skin cancer    Sleep apnea    uses cpap   Vaginal intraepithelial neoplasia grade 2 4/13   vin 3 with biopsy   Wears dentures    top   Wears glasses     Patient Active Problem List   Diagnosis Date Noted   Encounter for therapeutic drug monitoring 04/20/2013   Closed left hand fracture 11/17/2012   VIN III (vulvar intraepithelial neoplasia III) 05/28/2012   Rectus sheath hematoma 11/29/2011   Long term (current) use of anticoagulants 05/25/2010   SLEEP APNEA 10/12/2008   SKIN CANCER, HX OF 10/12/2008   Mixed hyperlipidemia 04/05/2008   ANXIETY STATE, UNSPECIFIED 04/05/2008   ESSENTIAL HYPERTENSION, BENIGN 04/05/2008   ATRIAL FIBRILLATION 04/05/2008    Past Surgical History:  Procedure Laterality Date   BUNIONECTOMY  05/2010   Left   CARPAL TUNNEL RELEASE Left 01/19/2013   Procedure: LEFT CARPAL TUNNEL RELEASE;  Surgeon: Tami Ribas, MD;  Location: [facility];  Service: Orthopedics;  Laterality: Left;   CESAREAN SECTION     COLONOSCOPY     DILATION AND CURETTAGE OF UTERUS  2005   endometrial polyp removed    OPEN REDUCTION  INTERNAL FIXATION (ORIF) DISTAL RADIAL FRACTURE Left 09/08/2012   Procedure: OPEN REDUCTION INTERNAL FIXATION (ORIF) DISTAL RADIAL FRACTURE;  Surgeon: Tami Ribas, MD;  Location: [facility];  Service: Orthopedics;  Laterality: Left;   POLYPECTOMY     SKIN CANCER EXCISION     TUBAL LIGATION      OB History     Gravida  1   Para  1   Term  1   Preterm      AB      Living  1      SAB      IAB      Ectopic      Multiple      Live Births  1            Home Medications    Prior to Admission medications   Medication Sig Start Date End Date Taking? Authorizing Provider  erythromycin ophthalmic ointment Place a 1/2 inch ribbon of ointment into the lower eyelid 4 times daily for 7 days. 10/05/22  Yes Porschia Willbanks, Rolly Salter E, FNP  atorvastatin (LIPITOR) 20 MG tablet Take 20 mg by mouth daily.  11/09/11   [provider]  ELIQUIS 5 MG TABS tablet TAKE 1 TABLET(5 MG) BY MOUTH TWICE  DAILY 08/05/22   Wendall Stade, MD  levothyroxine (SYNTHROID) 50 MCG tablet Take 50 mcg by mouth every morning. 03/29/20   [provider]  methocarbamol (ROBAXIN) 500 MG tablet Take 500 mg by mouth as needed for muscle spasms.    [provider]  metoprolol succinate (TOPROL-XL) 50 MG 24 hr tablet Take 1 tablet (50 mg total) by mouth daily. 06/12/22   Wendall Stade, MD  olmesartan (BENICAR) 40 MG tablet Take 1 tablet by mouth daily.    [provider]  sertraline (ZOLOFT) 50 MG tablet Take 50 mg by mouth at bedtime.    [provider]  traMADol (ULTRAM) 50 MG tablet Take 50 mg by mouth as needed for severe pain or moderate pain. 05/22/21   [provider]  verapamil (CALAN-SR) 240 MG CR tablet TAKE 1 TABLET(240 MG) BY MOUTH DAILY 07/23/22   Wendall Stade, MD    Family History Family History  Problem Relation Age of Onset   Lung cancer Mother    Stomach cancer Mother    Lung cancer Father    COPD Sister    Emphysema Sister     Breast cancer Paternal Grandmother    Colon cancer Neg Hx    Esophageal cancer Neg Hx    Rectal cancer Neg Hx     Social History Social History   Tobacco Use   Smoking status: Every Day    Types: Cigarettes   Smokeless tobacco: Never   Tobacco comments:    3 a day  Vaping Use   Vaping status: Never Used  Substance Use Topics   Alcohol use: Yes    Alcohol/week: 0.0 standard drinks of alcohol    Comment: occasional   Drug use: No     Allergies   Sulfa antibiotics, Sulfasalazine, and Sulfonamide derivatives   Review of Systems Review of Systems Per HPI  Physical Exam Triage Vital Signs ED Triage Vitals  Encounter Vitals Group     BP 10/05/22 1200 (!) 162/93     Systolic BP Percentile --      Diastolic BP Percentile --      Pulse Rate 10/05/22 1200 76     Resp 10/05/22 1200 16     Temp 10/05/22 1200 98.1 F (36.7 C)     Temp Source 10/05/22 1200 Oral     SpO2 10/05/22 1200 94 %     Weight --      Height --      Head Circumference --      Peak Flow --      Pain Score 10/05/22 1201 0     Pain Loc --      Pain Education --      Exclude from Growth Chart --    No data found.  Updated Vital Signs BP (!) 162/93 (BP Location: Left Arm)   Pulse 76   Temp 98.1 F (36.7 C) (Oral)   Resp 16   LMP 10/27/2006   SpO2 94%   Visual Acuity Right Eye Distance:   Left Eye Distance:   Bilateral Distance:    Right Eye Near:   Left Eye Near:    Bilateral Near:     Physical Exam Constitutional:      General: She is not in acute distress.    Appearance: Normal appearance. She is not toxic-appearing or diaphoretic.  HENT:     Head: Normocephalic and atraumatic.  Eyes:     General: Lids are normal. Lids are  everted, no foreign bodies appreciated. Vision grossly intact. Gaze aligned appropriately.     Extraocular Movements: Extraocular movements intact.     Conjunctiva/sclera:     Left eye: Left conjunctiva is injected. No chemosis, exudate or hemorrhage.     Pupils: Pupils are equal, round, and reactive to light.     Left eye: No corneal abrasion or fluorescein uptake.  Pulmonary:     Effort: Pulmonary effort is normal.  Neurological:     General: No focal deficit present.     Mental Status: She is alert and oriented to person, place, and time. Mental status is at baseline.  Psychiatric:        Mood and Affect: Mood normal.        Behavior: Behavior normal.        Thought Content: Thought content normal.        Judgment: Judgment normal.      UC Treatments / Results  Labs (all labs ordered are listed, but only abnormal results are displayed) Labs Reviewed - No data to display  EKG   Radiology No results found.  Procedures Procedures (including critical care time)  Medications Ordered in UC Medications - No data to display  Initial Impression / Assessment and Plan / UC Course  I have reviewed the triage vital signs and the nursing notes.  Pertinent labs & imaging results that were available during my care of the patient were reviewed by me and considered in my medical decision making (see chart for details).     Differential diagnoses include corneal abrasion versus bacterial conjunctivitis versus allergic conjunctivitis.  No fluorescein reuptake noted on exam.  Will treat with erythromycin ointment and patient advised to follow-up with her established ophthalmologist for further evaluation and management.  Visual acuity appears normal.  Patient verbalized understanding and was agreeable with plan. Final Clinical Impressions(s) / UC Diagnoses   Final diagnoses:  Corneal irritation of left eye     Discharge Instructions      I have prescribed an antibiotic ointment to apply to the eye.  Follow-up with your eye doctor Monday to schedule appointment for further evaluation.     ED Prescriptions     Medication Sig Dispense Auth. Provider   erythromycin ophthalmic ointment Place a 1/2 inch ribbon of ointment into the  lower eyelid 4 times daily for 7 days. 3.5 g Gustavus Bryant, [place]      PDMP not reviewed this encounter.   Gustavus Bryant, [place] 10/05/22 1236

## 2022-10-05 NOTE — ED Triage Notes (Signed)
Pt states she woke up this morning and her left eye was swollen and red.

## 2022-10-06 ENCOUNTER — Ambulatory Visit: Payer: Self-pay

## 2022-10-21 DIAGNOSIS — I10 Essential (primary) hypertension: Secondary | ICD-10-CM | POA: Diagnosis not present

## 2022-10-21 DIAGNOSIS — E785 Hyperlipidemia, unspecified: Secondary | ICD-10-CM | POA: Diagnosis not present

## 2022-10-21 DIAGNOSIS — I482 Chronic atrial fibrillation, unspecified: Secondary | ICD-10-CM | POA: Diagnosis not present

## 2022-10-21 DIAGNOSIS — J449 Chronic obstructive pulmonary disease, unspecified: Secondary | ICD-10-CM | POA: Diagnosis not present

## 2022-10-21 DIAGNOSIS — I700 Atherosclerosis of aorta: Secondary | ICD-10-CM | POA: Diagnosis not present

## 2022-10-21 DIAGNOSIS — E669 Obesity, unspecified: Secondary | ICD-10-CM | POA: Diagnosis not present

## 2022-11-14 DIAGNOSIS — N390 Urinary tract infection, site not specified: Secondary | ICD-10-CM | POA: Diagnosis not present

## 2023-02-12 ENCOUNTER — Other Ambulatory Visit: Payer: Self-pay | Admitting: Cardiovascular Disease

## 2023-04-08 ENCOUNTER — Other Ambulatory Visit: Payer: Self-pay | Admitting: Cardiovascular Disease

## 2023-04-10 ENCOUNTER — Other Ambulatory Visit (HOSPITAL_COMMUNITY): Payer: Self-pay

## 2023-05-07 DIAGNOSIS — I10 Essential (primary) hypertension: Secondary | ICD-10-CM | POA: Diagnosis not present

## 2023-05-07 DIAGNOSIS — E039 Hypothyroidism, unspecified: Secondary | ICD-10-CM | POA: Diagnosis not present

## 2023-05-07 DIAGNOSIS — Z1212 Encounter for screening for malignant neoplasm of rectum: Secondary | ICD-10-CM | POA: Diagnosis not present

## 2023-05-07 DIAGNOSIS — E785 Hyperlipidemia, unspecified: Secondary | ICD-10-CM | POA: Diagnosis not present

## 2023-05-14 DIAGNOSIS — D6869 Other thrombophilia: Secondary | ICD-10-CM | POA: Diagnosis not present

## 2023-05-14 DIAGNOSIS — I700 Atherosclerosis of aorta: Secondary | ICD-10-CM | POA: Diagnosis not present

## 2023-05-14 DIAGNOSIS — Z1339 Encounter for screening examination for other mental health and behavioral disorders: Secondary | ICD-10-CM | POA: Diagnosis not present

## 2023-05-14 DIAGNOSIS — M5417 Radiculopathy, lumbosacral region: Secondary | ICD-10-CM | POA: Diagnosis not present

## 2023-05-14 DIAGNOSIS — E039 Hypothyroidism, unspecified: Secondary | ICD-10-CM | POA: Diagnosis not present

## 2023-05-14 DIAGNOSIS — E785 Hyperlipidemia, unspecified: Secondary | ICD-10-CM | POA: Diagnosis not present

## 2023-05-14 DIAGNOSIS — Z720 Tobacco use: Secondary | ICD-10-CM | POA: Diagnosis not present

## 2023-05-14 DIAGNOSIS — I482 Chronic atrial fibrillation, unspecified: Secondary | ICD-10-CM | POA: Diagnosis not present

## 2023-05-14 DIAGNOSIS — Z0000 Encounter for general adult medical examination without abnormal findings: Secondary | ICD-10-CM | POA: Diagnosis not present

## 2023-05-14 DIAGNOSIS — R82998 Other abnormal findings in urine: Secondary | ICD-10-CM | POA: Diagnosis not present

## 2023-05-14 DIAGNOSIS — Z1331 Encounter for screening for depression: Secondary | ICD-10-CM | POA: Diagnosis not present

## 2023-05-14 DIAGNOSIS — E669 Obesity, unspecified: Secondary | ICD-10-CM | POA: Diagnosis not present

## 2023-05-14 DIAGNOSIS — J449 Chronic obstructive pulmonary disease, unspecified: Secondary | ICD-10-CM | POA: Diagnosis not present

## 2023-05-14 DIAGNOSIS — Z7901 Long term (current) use of anticoagulants: Secondary | ICD-10-CM | POA: Diagnosis not present

## 2023-05-14 DIAGNOSIS — I10 Essential (primary) hypertension: Secondary | ICD-10-CM | POA: Diagnosis not present

## 2023-05-26 NOTE — Progress Notes (Signed)
 Cardiology Office Note   Date:  06/02/2023   ID:  [name], [name] May 27, 1950, MRN [id number]  PCP:  Geoffry Paradise, MD  Cardiologist:  Dr. Eden Emms, MD  No chief complaint on file.   History of Present Illness: Linda Ruiz is a 73 y.o. female who presents for follow-up of chronic atrial fibrillation transitioned from Coumadin to Eliquis 06/2018, tobacco use, HTN and HLD.  Echocardiogram 09/2016 with EF at 60 to 65% with mild LAE and no significant valve disease. Myoview stress test 09/2016 were normal with no ischemia or infarct.  Husband of 37years  left her in January 2022  so it has been quite and adjustment. Her daughter is living with her right now and she has a sister close by for support. She quit smoking for a month prior to that however resumed with the increased stress. AF remains rate controlled. PCP added Benicar to her regimen because she was having increased BPs with HAs. Sine the addition, BPs have been great. She denies chest pain, palpitations, DOE, SOB, LE edema, orthopnea, or syncope.   Has lost about 25 lbs and feels better No cardiac issues Divorced 3 years Daughter lives with her. 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   vin 3 with biopsy   Wears dentures    top   Wears glasses     Past Surgical History:  Procedure Laterality Date   BUNIONECTOMY  05/2010   Left   CARPAL TUNNEL RELEASE Left 01/19/2013   Procedure: LEFT CARPAL TUNNEL RELEASE;  Surgeon: Tami Ribas, MD;  Location:   [facility];  Service: Orthopedics;  Laterality: Left;   CESAREAN SECTION     COLONOSCOPY     DILATION AND CURETTAGE OF UTERUS  2005   endometrial polyp removed    OPEN REDUCTION INTERNAL FIXATION (ORIF) DISTAL RADIAL FRACTURE Left 09/08/2012   Procedure: OPEN REDUCTION INTERNAL FIXATION (ORIF) DISTAL RADIAL FRACTURE;  Surgeon: Tami Ribas, MD;  Location:  SUR[facility]ERY CENTER;  Service: Orthopedics;  Laterality: Left;   POLYPECTOMY     SKIN CANCER EXCISION     TUBAL LIGATION       Current Outpatient Medications  Medication Sig Dispense Refill   atorvastatin (LIPITOR) 20 MG tablet Take 20 mg by mouth daily.      ELIQUIS 5 MG TABS tablet TAKE 1 TABLET(5 MG) BY MOUTH TWICE DAILY 180 tablet 1   levothyroxine (SYNTHROID) 50 MCG tablet Take 50 mcg by mouth every morning.     metoprolol succinate (TOPROL-XL) 50 MG 24 hr tablet TAKE 1 TABLET(50 MG) BY MOUTH DAILY 90 tablet 1   olmesartan (BENICAR) 40 MG tablet Take 1 tablet by mouth daily.     sertraline (ZOLOFT) 50 MG tablet Take 50 mg by mouth at bedtime.     verapamil (CALAN-SR) 240 MG CR tablet TAKE 1 TABLET(240 MG) BY  MOUTH DAILY 30 tablet 11   No current facility-administered medications for this visit.    Allergies:   Sulfa antibiotics, Sulfasalazine, and Sulfonamide derivatives    Social History:  The patient  reports that she has been smoking cigarettes. She has never used smokeless tobacco. She reports current alcohol use. She reports that she does not use drugs.   Family History:  The patient's family history includes Breast cancer in her paternal grandmother; COPD in her sister; Emphysema in her sister; Lung cancer in her father and mother; Stomach cancer in her mother.    ROS:  Please see the history of present illness.   Otherwise, review of systems are positive for none.  All other systems are reviewed and negative.    PHYSICAL EXAM: VS:  BP 130/78   Pulse 61   Ht 5\' 2"  (1.575 m)   Wt 190 lb 3.2 oz (86.3 kg)    LMP 10/27/2006   SpO2 97%   BMI 34.79 kg/m  , BMI Body mass index is 34.79 kg/m.   Affect appropriate Healthy:  appears stated age HEENT: normal Neck supple with no adenopathy JVP normal no bruits no thyromegaly Lungs clear with no wheezing and good diaphragmatic motion Heart:  S1/S2 no murmur, no rub, gallop or click PMI normal Abdomen: benighn, BS positve, no tenderness, no AAA no bruit.  No HSM or HJR Distal pulses intact with no bruits No edema Neuro non-focal Skin warm and dry No muscular weakness .    EKG:   Afib rate 55 nonspecific ST changes 03/16/19  06/02/2023 afib rate 69 nonspecific ST changes    Recent Labs: 07/08/2022: BUN 12; Creatinine, Ser 1.12; Hemoglobin 15.5; Platelets 194; Potassium 4.5; Sodium 141    Lipid Panel    Component Value Date/Time   CHOL 192 04/06/2008 0923   TRIG 151 (H) 04/06/2008 0923   HDL 37.1 (L) 04/06/2008 0923   CHOLHDL 5.2 CALC 04/06/2008 0923   VLDL 30 04/06/2008 0923   LDLCALC 125 (H) 04/06/2008 0923     Wt Readings from Last 3 Encounters:  06/02/23 190 lb 3.2 oz (86.3 kg)  07/08/22 186 lb (84.4 kg)  06/25/21 174 lb (78.9 kg)    Other studies Reviewed: Additional studies/ records that were reviewed today include: Review of the above records demonstrates:   Stress test 10/15/2016:  Nuclear stress EF: 73%. The left ventricular ejection fraction is hyperdynamic (>65%). There was no ST segment deviation noted during stress. This is a low risk study.   1. EF 73%, normal wall motion.  2. Fixed small, mild basal anterolateral perfusion defect.  Given normal wall motion, this may represent attenuation.  No ischemia.    Low risk study.   Echocardiogram 10/15/2016:  Nuclear stress EF: 73%. The left ventricular ejection fraction is hyperdynamic (>65%). There was no ST segment deviation noted during stress. This is a low risk study.   1. EF 73%, normal wall motion.  2. Fixed small, mild basal anterolateral perfusion  defect.  Given normal wall motion, this may represent attenuation.  No ischemia.    Low risk study.   ASSESSMENT AND PLAN:  1. Chronic atrial fibrillation: -Rate controlled with Toprol-XL, vermapamil. -Anticoagulated with Eliquis with no signs or symptoms of bleeding in stool or urine CHADVASC 3  2. HTN: -Follows with PCP -On Olmesartan, verapamil and Toprol  -f/u primary    3. Tobacco use: -Cessation strongly encouraged>>quit for a month however with increased stress of separation she returned to smoking  -  Last lung cancer screening negative 07/24/21 will update  4. Depression: -Stable on Zoloft -Per PCP  5. HLD:   - continue lipitor 20 mg labs with priamry  Current medicines are reviewed at length with the patient today.  The patient does not have concerns regarding medicines.  The following changes have been made:  no change  Labs/ tests ordered today include: None   No orders of the defined types were placed in this encounter.  Lung cancer CT    Disposition:   FU in a year   Signed, Charlton Haws, MD  06/02/2023 9:23 AM    [facility] [address] [place], [place], [place]  [address] Phone: [phone]; Fax: [phone]

## 2023-06-02 ENCOUNTER — Encounter: Payer: Self-pay | Admitting: Cardiovascular Disease

## 2023-06-02 ENCOUNTER — Ambulatory Visit: Attending: Cardiovascular Disease | Admitting: Cardiovascular Disease

## 2023-06-02 VITALS — BP 130/78 | HR 61 | Ht 62.0 in | Wt 190.2 lb

## 2023-06-02 DIAGNOSIS — I4821 Permanent atrial fibrillation: Secondary | ICD-10-CM | POA: Diagnosis not present

## 2023-06-02 DIAGNOSIS — J449 Chronic obstructive pulmonary disease, unspecified: Secondary | ICD-10-CM

## 2023-06-02 DIAGNOSIS — I10 Essential (primary) hypertension: Secondary | ICD-10-CM

## 2023-06-02 DIAGNOSIS — Z87891 Personal history of nicotine dependence: Secondary | ICD-10-CM | POA: Diagnosis not present

## 2023-06-02 DIAGNOSIS — E782 Mixed hyperlipidemia: Secondary | ICD-10-CM | POA: Diagnosis not present

## 2023-06-02 NOTE — Patient Instructions (Addendum)
 Medication Instructions:  Your physician recommends that you continue on your current medications as directed. Please refer to the Current Medication list given to you today.  *If you need a refill on your cardiac medications before your next appointment, please call your pharmacy*  Lab Work: If you have labs (blood work) drawn today and your tests are completely normal, you will receive your results only by: MyChart Message (if you have MyChart) OR A paper copy in the mail If you have any lab test that is abnormal or we need to change your treatment, we will call you to review the results.  Testing/Procedures: Lung Cancer Screening CT scanning, (CAT scanning), is a noninvasive, special x-ray that produces cross-sectional images of the body using x-rays and a computer. CT scans help physicians diagnose and treat medical conditions. For some CT exams, a contrast material is used to enhance visibility in the area of the body being studied. CT scans provide greater clarity and reveal more details than regular x-ray exams.   Follow-Up: At [facility], you and your health needs are our priority.  As part of our continuing mission to provide you with exceptional heart care, our providers are all part of one team.  This team includes your primary Cardiologist (physician) and Advanced Practice Providers or APPs (Physician Assistants and Nurse Practitioners) who all work together to provide you with the care you need, when you need it.  Your next appointment:   1 year(s)  Provider:   Charlton Haws, MD     We recommend signing up for the patient portal called "MyChart".  Sign up information is provided on this After Visit Summary.  MyChart is used to connect with patients for Virtual Visits (Telemedicine).  Patients are able to view lab/test results, encounter notes, upcoming appointments, etc.  Non-urgent messages can be sent to your provider as well.   To learn more about what you can do  with MyChart, go to [email or web address].   Other Instructions       1st Floor: - Lobby - Registration  - Pharmacy  - Lab - Cafe  2nd Floor: - PV Lab - Diagnostic Testing (echo, CT, nuclear med)  3rd Floor: - Vacant  4th Floor: - TCTS (cardiothoracic surgery) - AFib Clinic - Structural Heart Clinic - Vascular Surgery  - Vascular Ultrasound  5th Floor: - HeartCare Cardiology (general and EP) - Clinical Pharmacy for coumadin, hypertension, lipid, weight-loss medications, and med management appointments    Valet parking services will be available as well.

## 2023-06-09 ENCOUNTER — Other Ambulatory Visit: Payer: Self-pay | Admitting: Cardiovascular Disease

## 2023-06-09 DIAGNOSIS — I4821 Permanent atrial fibrillation: Secondary | ICD-10-CM

## 2023-06-09 NOTE — Telephone Encounter (Signed)
 Eliquis 5mg  refill request received. Patient is 73 years old, weight-86.3kg, Crea-1.12 on 07/08/22, Diagnosis-Afib, and last seen by Dr Stann Earnest on 06/02/23. Dose is appropriate based on dosing criteria. Will send in refill to requested pharmacy.

## 2023-06-10 ENCOUNTER — Ambulatory Visit (HOSPITAL_COMMUNITY)
Admission: RE | Admit: 2023-06-10 | Discharge: 2023-06-10 | Disposition: A | Discharge status: Home / Self Care | Source: Ambulatory Visit | Attending: Cardiovascular Disease | Admitting: Cardiovascular Disease

## 2023-06-10 DIAGNOSIS — J449 Chronic obstructive pulmonary disease, unspecified: Secondary | ICD-10-CM | POA: Diagnosis not present

## 2023-06-10 DIAGNOSIS — Z87891 Personal history of nicotine dependence: Secondary | ICD-10-CM | POA: Insufficient documentation

## 2023-06-10 DIAGNOSIS — F17210 Nicotine dependence, cigarettes, uncomplicated: Secondary | ICD-10-CM | POA: Diagnosis not present

## 2023-07-10 ENCOUNTER — Ambulatory Visit: Payer: Self-pay | Admitting: Cardiovascular Disease

## 2023-07-18 ENCOUNTER — Other Ambulatory Visit: Payer: Self-pay

## 2023-07-18 ENCOUNTER — Other Ambulatory Visit (HOSPITAL_COMMUNITY): Payer: Self-pay

## 2023-07-18 ENCOUNTER — Telehealth: Payer: Self-pay | Admitting: Pharmacy Technician

## 2023-07-18 ENCOUNTER — Telehealth: Payer: Self-pay | Admitting: Cardiovascular Disease

## 2023-07-18 MED ORDER — VERAPAMIL HCL ER 240 MG PO TBCR: 240.0000 mg | EXTENDED_RELEASE_TABLET | Freq: Every day | ORAL | 3 refills | Status: AC

## 2023-07-18 NOTE — Telephone Encounter (Signed)
 Pharmacy Patient Advocate Encounter   Received notification from Pt Calls Messages that prior authorization for Verapamil  is required/requested.   Insurance verification completed.   The patient is insured through [facility] ADVANTAGE/RX ADVANCE .   Per test claim: The current 07/18/23 day co-pay is, $5.00- one month.  No PA needed at this time. This test claim was processed through [facility]- copay amounts may vary at other pharmacies due to pharmacy/plan contracts, or as the patient moves through the different stages of their insurance plan.

## 2023-07-18 NOTE — Telephone Encounter (Signed)
 Refill sent to pharmacy.

## 2023-07-18 NOTE — Telephone Encounter (Signed)
 Pt c/o medication issue:  1. Name of Medication: verapamil  (CALAN -SR) 240 MG CR tablet   2. How are you currently taking this medication (dosage and times per day)? As written  3. Are you having a reaction (difficulty breathing--STAT)? No   4. What is your medication issue? Pt calling to get prior auth on medication

## 2023-08-13 ENCOUNTER — Telehealth: Payer: Self-pay | Admitting: Cardiovascular Disease

## 2023-08-13 MED ORDER — METOPROLOL SUCCINATE ER 50 MG PO TB24: 50.0000 mg | ORAL_TABLET | Freq: Every day | ORAL | 3 refills | Status: AC

## 2023-08-13 NOTE — Telephone Encounter (Signed)
*  STAT* If patient is at the pharmacy, call can be transferred to refill team.   1. Which medications need to be refilled? (please list name of each medication and dose if known) Metoprolol    2. Would you like to learn more about the convenience, safety, & potential cost savings by using the [facility]?    3. Are you open to using the [facility] (Type [facility].   4. Which pharmacy/location (including street and city if local pharmacy) is medication to be sent to?[facility] [address] [place]   5. Do they need a 30 day or 90 day supply? 90 days and refill- please call today- out of medicine

## 2023-08-13 NOTE — Telephone Encounter (Signed)
 Pt's medication was sent to pt's pharmacy as requested. Confirmation received.

## 2023-11-05 DIAGNOSIS — Z23 Encounter for immunization: Secondary | ICD-10-CM | POA: Diagnosis not present

## 2023-11-05 DIAGNOSIS — J449 Chronic obstructive pulmonary disease, unspecified: Secondary | ICD-10-CM | POA: Diagnosis not present

## 2023-11-05 DIAGNOSIS — Z720 Tobacco use: Secondary | ICD-10-CM | POA: Diagnosis not present

## 2023-11-05 DIAGNOSIS — I10 Essential (primary) hypertension: Secondary | ICD-10-CM | POA: Diagnosis not present

## 2023-11-05 DIAGNOSIS — E785 Hyperlipidemia, unspecified: Secondary | ICD-10-CM | POA: Diagnosis not present

## 2023-11-05 DIAGNOSIS — D6869 Other thrombophilia: Secondary | ICD-10-CM | POA: Diagnosis not present

## 2023-11-05 DIAGNOSIS — I482 Chronic atrial fibrillation, unspecified: Secondary | ICD-10-CM | POA: Diagnosis not present

## 2023-11-05 DIAGNOSIS — M4800 Spinal stenosis, site unspecified: Secondary | ICD-10-CM | POA: Diagnosis not present

## 2023-11-05 DIAGNOSIS — Z7901 Long term (current) use of anticoagulants: Secondary | ICD-10-CM | POA: Diagnosis not present

## 2023-11-17 DIAGNOSIS — N390 Urinary tract infection, site not specified: Secondary | ICD-10-CM | POA: Diagnosis not present

## 2023-11-17 DIAGNOSIS — R350 Frequency of micturition: Secondary | ICD-10-CM | POA: Diagnosis not present

## 2024-03-05 ENCOUNTER — Other Ambulatory Visit (HOSPITAL_COMMUNITY): Payer: Self-pay | Admitting: Internal Medicine

## 2024-03-05 ENCOUNTER — Ambulatory Visit (HOSPITAL_COMMUNITY)
Admission: RE | Admit: 2024-03-05 | Discharge: 2024-03-05 | Disposition: A | Source: Ambulatory Visit | Attending: Vascular Surgery

## 2024-03-05 DIAGNOSIS — M79605 Pain in left leg: Secondary | ICD-10-CM

## 2024-03-05 DIAGNOSIS — M7989 Other specified soft tissue disorders: Secondary | ICD-10-CM | POA: Insufficient documentation
